# Patient Record
Sex: Male | Born: 1945 | Race: White | Hispanic: No | Marital: Married | Smoking: Former smoker
Health system: Southern US, Community
[De-identification: ages and names within clinical notes are randomized; demographics above are authoritative.]

## PROBLEM LIST (undated history)

## (undated) DIAGNOSIS — C50919 Malignant neoplasm of unspecified site of unspecified female breast: Secondary | ICD-10-CM

## (undated) DIAGNOSIS — Z87442 Personal history of urinary calculi: Secondary | ICD-10-CM

## (undated) DIAGNOSIS — Z8739 Personal history of other diseases of the musculoskeletal system and connective tissue: Secondary | ICD-10-CM

## (undated) DIAGNOSIS — E785 Hyperlipidemia, unspecified: Secondary | ICD-10-CM

## (undated) DIAGNOSIS — M1990 Unspecified osteoarthritis, unspecified site: Secondary | ICD-10-CM

## (undated) DIAGNOSIS — C61 Malignant neoplasm of prostate: Secondary | ICD-10-CM

## (undated) DIAGNOSIS — F32A Depression, unspecified: Secondary | ICD-10-CM

## (undated) DIAGNOSIS — F419 Anxiety disorder, unspecified: Secondary | ICD-10-CM

## (undated) DIAGNOSIS — I10 Essential (primary) hypertension: Secondary | ICD-10-CM

## (undated) DIAGNOSIS — N189 Chronic kidney disease, unspecified: Secondary | ICD-10-CM

## (undated) DIAGNOSIS — I739 Peripheral vascular disease, unspecified: Secondary | ICD-10-CM

## (undated) DIAGNOSIS — Z86718 Personal history of other venous thrombosis and embolism: Secondary | ICD-10-CM

## (undated) DIAGNOSIS — H9319 Tinnitus, unspecified ear: Secondary | ICD-10-CM

## (undated) DIAGNOSIS — K219 Gastro-esophageal reflux disease without esophagitis: Secondary | ICD-10-CM

## (undated) HISTORY — PX: TONSILLECTOMY: SUR1361

## (undated) HISTORY — PX: MASTECTOMY: SHX3

## (undated) HISTORY — PX: PROSTATE BIOPSY: SHX241

---

## 2002-02-20 ENCOUNTER — Ambulatory Visit (HOSPITAL_BASED_OUTPATIENT_CLINIC_OR_DEPARTMENT_OTHER): Admission: RE | Admit: 2002-02-20 | Discharge: 2002-02-20 | Payer: Self-pay | Admitting: Plastic Surgery

## 2002-02-20 ENCOUNTER — Encounter (INDEPENDENT_AMBULATORY_CARE_PROVIDER_SITE_OTHER): Payer: Self-pay | Admitting: Specialist

## 2004-12-08 ENCOUNTER — Ambulatory Visit (HOSPITAL_COMMUNITY): Admission: RE | Admit: 2004-12-08 | Discharge: 2004-12-08 | Payer: Self-pay | Admitting: Gastroenterology

## 2004-12-08 ENCOUNTER — Encounter (INDEPENDENT_AMBULATORY_CARE_PROVIDER_SITE_OTHER): Payer: Self-pay | Admitting: Specialist

## 2008-12-28 ENCOUNTER — Ambulatory Visit: Payer: Self-pay | Admitting: Radiology

## 2008-12-28 ENCOUNTER — Emergency Department (HOSPITAL_BASED_OUTPATIENT_CLINIC_OR_DEPARTMENT_OTHER): Admission: EM | Admit: 2008-12-28 | Discharge: 2008-12-28 | Payer: Self-pay | Admitting: Emergency Medicine

## 2010-02-27 ENCOUNTER — Observation Stay (HOSPITAL_COMMUNITY): Admission: EM | Admit: 2010-02-27 | Discharge: 2010-02-27 | Payer: Self-pay | Admitting: Emergency Medicine

## 2010-02-27 ENCOUNTER — Encounter: Admission: RE | Admit: 2010-02-27 | Discharge: 2010-02-27 | Payer: Self-pay | Admitting: Family Medicine

## 2011-01-13 LAB — DIFFERENTIAL
Basophils Absolute: 0 10*3/uL (ref 0.0–0.1)
Monocytes Absolute: 0.6 10*3/uL (ref 0.1–1.0)
Monocytes Relative: 10 % (ref 3–12)
Neutrophils Relative %: 63 % (ref 43–77)

## 2011-01-13 LAB — BASIC METABOLIC PANEL
BUN: 14 mg/dL (ref 6–23)
CO2: 26 mEq/L (ref 19–32)
Chloride: 110 mEq/L (ref 96–112)
Creatinine, Ser: 1.18 mg/dL (ref 0.4–1.5)
GFR calc non Af Amer: >60 mL/min (ref >60)
Sodium: 142 mEq/L (ref 135–145)

## 2011-01-13 LAB — CBC
Hemoglobin: 14.2 g/dL (ref 13.0–17.0)
MCHC: 34.9 g/dL (ref 30.0–36.0)
MCV: 87.7 fL (ref 78.0–100.0)

## 2011-01-13 LAB — APTT: aPTT: 67 seconds — ABNORMAL HIGH (ref 24–37)

## 2011-02-05 LAB — URINALYSIS, ROUTINE W REFLEX MICROSCOPIC
Bilirubin Urine: NEGATIVE
Glucose, UA: NEGATIVE mg/dL
Leukocytes, UA: NEGATIVE
Specific Gravity, Urine: 1.019 (ref 1.005–1.030)
Urobilinogen, UA: 0.2 mg/dL (ref 0.0–1.0)
pH: 6 (ref 5.0–8.0)

## 2011-02-05 LAB — URINE MICROSCOPIC-ADD ON

## 2011-03-13 NOTE — Op Note (Signed)
NAMEJOANDRY, Douglas Gay                ACCOUNT NO.:  [id number]   MEDICAL RECORD NO.:  [id number]          PATIENT TYPE:  AMB   LOCATION:  ENDO                         FACILITY:  [facility]   PHYSICIAN:  Anselmo Rod, M.D.  DATE OF BIRTH:  1946-01-01   DATE OF PROCEDURE:  12/08/2004  DATE OF DISCHARGE:  12/08/2004                                 OPERATIVE REPORT   PROCEDURE:  Colonoscopy with snare polypectomy and cold biopsies.   ENDOSCOPIST:  Anselmo Rod, M.D.   INSTRUMENT USED:  Olympus video colonoscope.   INDICATION FOR PROCEDURE:  A 65 year old white male who is undergoing a  screening colonoscopy to rule out colonic polyps, masses, hemorrhoids, etc.   PREPROCEDURE PREPARATION:  Informed consent was procured from the patient  and the patient fasted for 8 hours prior to the procedure and prepped with a  bottle of magnesium citrate and a gallon of GoLYTELY the night prior to the  procedure.  The risks and benefits of the procedure were discussed with the  patient in great detail, including the 10% miss rate of cancer and polyps.   PREPROCEDURE PHYSICAL:  The patient had stable vital signs, neck supple,  chest clear to auscultation, S1/S2 regular, abdomen soft with normal bowel  sounds.   DESCRIPTION OF PROCEDURE:  The patient was placed in the left lateral  decubitus position and sedated with 70 mg of Demerol and 10 mg of Versed in  slow incremental doses.  Once the patient was adequately sedated and  maintained on low-flow oxygen and continuous cardiac monitoring, the Olympus  video colonoscope was advanced from the rectum to the cecum.  There was a  significant amount of residual stool in the right colon and multiple washes  were done.  A small polyp was snared from 38 cm and internal hemorrhoids  were seen on retroflexion.  There was no evidence of sigmoid diverticulosis.  Biopsies were taken from the right colon.  The patient tolerated the  procedure well without  complications.   IMPRESSION:  1.  Small internal hemorrhoids seen on retroflexion.  2.  Small sessile polyp snared from 38 cm.  3.  Sigmoid diverticulosis.  4.  Biopsies removed from the proximal right colon.   RECOMMENDATIONS:  1.  Await pathology results.  2.  Avoid nonsteroidals, including aspirin for the next 4 weeks.  3.  Repeat colonoscopy depending on pathology results.  4.  Outpatient followup as need arises in the future.      JNM/MEDQ  D:  12/10/2004  T:  12/10/2004  Job:  [id number]   cc:   Evelena Peat, M.D.  [address]  [place]  [place] [address]  Fax: [phone]

## 2011-03-13 NOTE — Op Note (Signed)
[place]  Patient:    Douglas Gay, Douglas Gay Visit Number: [id number] MRN: [id number]          Service Type: DSU Location: [facility] Attending Physician:  Loura Halt Ii Dictated by:   Alfredia Ferguson, M.D. Proc. Date: 02/20/02 Admit Date:  02/20/2002                             Operative Report  PREOPERATIVE DIAGNOSIS: 1. A 5 mm pigmented nevus, right lower cheek. 2. A 5 mm pigmented nevus, right upper cheek. 3. A 5 mm pigmented nevus, left chin. 4. A 5 mm pigmented nevus, left lower cheek. 5. An 8 mm pigmented nevus, left mid cheek.  POSTOPERATIVE DIAGNOSIS: 1. A 5 mm pigmented nevus, right lower cheek. 2. A 5 mm pigmented nevus, right upper cheek. 3. A 5 mm pigmented nevus, left chin. 4. A 5 mm pigmented nevus, left lower cheek. 5. An 8 mm pigmented nevus, left mid cheek.  OPERATION PERFORMED:  Excision of pigmented nevi times five, left and right cheeks.  SURGEON:  Alfredia Ferguson, M.D.  ANESTHESIA:  2% Xylocaine with 1:100,000 epinephrine.  INDICATIONS FOR PROCEDURE:  The patient is a 65 year old gentleman who complains of slowly enlarging and darkening lesions on his face.  Because of the recent growth pattern, the patient wishes to have them removed.  None of the lesions appear malignant.  One is particularly dark in the left mid cheek. The patient wishes to proceed with removal in spite of the fact that he may be trading what he has for permanent and potentially unsightly scar.  DESCRIPTION OF PROCEDURE:  Skin markers were placed around each of the five lesions in elliptical fashion and local anesthesia was infiltrated.  Attention was first directed to the right cheek.  The cheek was prepped and draped in sterile fashion.  Elliptical excisions of both lesions were carried out down to the level of the subcutaneous tissues.  The lesions were passed off for pathology.  Hemostasis was accomplished using pressure.  The wounds  were closed with approximating the dermis using interrupted 5-0 Vicryl suture. Skin was united using a running 6-0 nylon suture for both incisions.  The cheek was cleansed, dried and light dressings were applied.  Attention was directed to the left cheek.  The lesion on the left chin, the left lower cheek and the left mid cheek were removed.  Each incision was excised in a similar fashion to the right side and closure was carried out in identical fashion. On completion of the closure, a light dressing was applied and the patient was discharged home in satisfactory condition. Dictated by:   Alfredia Ferguson, M.D. Attending Physician:  Loura Halt Ii DD:  02/20/02 TD:  02/20/02 Job: [id number]

## 2012-03-12 ENCOUNTER — Emergency Department (HOSPITAL_BASED_OUTPATIENT_CLINIC_OR_DEPARTMENT_OTHER)
Admission: EM | Admit: 2012-03-12 | Discharge: 2012-03-12 | Disposition: A | Discharge status: Home / Self Care | Payer: Medicare Other | Attending: Emergency Medicine | Admitting: Emergency Medicine

## 2012-03-12 ENCOUNTER — Emergency Department (HOSPITAL_BASED_OUTPATIENT_CLINIC_OR_DEPARTMENT_OTHER): Payer: Medicare Other

## 2012-03-12 ENCOUNTER — Encounter (HOSPITAL_BASED_OUTPATIENT_CLINIC_OR_DEPARTMENT_OTHER): Payer: Self-pay | Admitting: *Deleted

## 2012-03-12 DIAGNOSIS — I82409 Acute embolism and thrombosis of unspecified deep veins of unspecified lower extremity: Secondary | ICD-10-CM

## 2012-03-12 DIAGNOSIS — M79609 Pain in unspecified limb: Secondary | ICD-10-CM | POA: Insufficient documentation

## 2012-03-12 DIAGNOSIS — I824Y9 Acute embolism and thrombosis of unspecified deep veins of unspecified proximal lower extremity: Secondary | ICD-10-CM | POA: Insufficient documentation

## 2012-03-12 DIAGNOSIS — R609 Edema, unspecified: Secondary | ICD-10-CM | POA: Insufficient documentation

## 2012-03-12 MED ORDER — WARFARIN SODIUM 5 MG PO TABS: 5.0000 mg | ORAL_TABLET | Freq: Every day | ORAL | Status: DC

## 2012-03-12 MED ORDER — ENOXAPARIN SODIUM 100 MG/ML ~~LOC~~ SOLN
1.0000 mg/kg | Freq: Once | SUBCUTANEOUS | Status: AC
  Administered 2012-03-12: 85 mg via SUBCUTANEOUS
  Filled 2012-03-12: qty 1

## 2012-03-12 MED ORDER — ENOXAPARIN SODIUM 150 MG/ML ~~LOC~~ SOLN: 1.0000 mg/kg | Freq: Two times a day (BID) | SUBCUTANEOUS | Status: DC

## 2012-03-12 NOTE — Discharge Instructions (Signed)
Take coumadin as prescribed. Take lovenox as prescribed for the next 5 days while your coumadin level becomes therapeutic.  Follow up with your primary care doctor this Monday - arrange follow up on coumadin level/INR.  You may also discuss alternative anticoagulation strategies with your doctor then including pradaxa.  Return to [facility] ER right away if worse, severe leg pain, chest pain, trouble breathing, other concern.      Deep Vein Thrombosis A deep vein thrombosis (DVT) is a blood clot (thrombus) that develops in a deep vein. A DVT is a clot in the deep, larger veins of the leg, arm, or pelvis. These are more dangerous than clots that might form in veins on the surface of the body. Deep vein thrombosis can lead to complications if the clot breaks off and travels in the bloodstream to the lungs. CAUSES Blood clots form in a vein for different reasons. Usually several things cause blood clots. They include:  The flow of blood slows down.   The inside of the vein is damaged in some way.   The person has a condition that makes blood clot more easily. These conditions may include:   Older age (especially over 37 years old).   Having a history of blood clots.   Having major or lengthy surgery. Hip surgery is particularly high-risk.   Breaking a hip or leg.   Sitting or lying still for a long time.   Cancer or cancer treatment.   Having a long, thin tube (catheter) placed inside a vein during a medical procedure.   Being overweight (obese).   Pregnancy and childbirth.   Medicines with estrogen.   Smoking.   Other circulation or heart problems.  SYMPTOMS When a clot forms, it can either partially or totally block the blood flow in that vein. Symptoms of a DVT can include:  Swelling of the leg or arm, especially if one side is much worse.   Warmth and redness of the leg or arm, especially if one side is much worse.   Pain in an arm or leg. If the clot is in the leg,  symptoms may be more noticeable or worse when standing or walking.  If the blood clot travels to the lung, it may cause:  Shortness of breath.   Chest pain. The pain may be worsened by deep breaths.   Coughing up thick mucus (phlegm), possibly flecked with blood.  Anyone with these symptoms should get emergency medical treatment right away. Call your local emergency services (911 in [place] if you have these symptoms. DIAGNOSIS If a DVT is suspected, your caregiver will take a full medical history. He or she will also perform a physical exam. Tests that also may be required include:  Studies of the clotting properties of the blood.   An ultrasound scan.   X-rays to show the flow of blood when special dye is injected into the veins (venography).   Studies of your lungs if you have any chest symptoms.  PREVENTION  Exercise the legs regularly. Take a brisk 30 minute walk every day.   Maintain a weight that is appropriate for your height.   Avoid sitting or lying in bed for long periods of time without moving your legs.   Women, particularly those over the age of 34, should consider the risks and benefits of taking estrogen medicines, including birth control pills.   Do not smoke, especially if you take estrogen medicines.   Long-distance travel can increase your risk. You  should exercise your legs by walking or pumping the muscles every hour.   In hospital prevention:   Prevention may include medical and nonmedical measures.  TREATMENT  The most common treatment for DVT is blood thinning (anticoagulant) medicine, which reduces the blood's tendency to clot. Anticoagulants can stop new blood clots from forming and old ones from growing. They cannot dissolve existing clots. Your body does this by itself over time. Anticoagulants can be given by mouth, by intravenous (IV) access, or by injection. Your caregiver will determine the best program for you.   Less commonly, clot-dissolving  drugs (thrombolytics) are used to dissolve a DVT. They carry a high risk of bleeding, so they are used mainly in severe cases.   Very rarely, a blood clot in the leg needs to be removed surgically.   If you are unable to take anticoagulants, your caregiver may arrange for you to have a filter placed in a main vein in your belly (abdomen). This filter prevents clots from traveling to your lungs.  HOME CARE INSTRUCTIONS  Take all medicines prescribed by your caregiver. Follow the directions carefully.   You will most likely continue taking anticoagulants after you leave the hospital. Your caregiver will advise you on the length of treatment (usually 3 to 6 months, sometimes for life).   Taking too much or too little of an anticoagulant is dangerous. While taking this type of medicine, you will need to have regular blood tests to be sure the dose is correct. The dose can change for many reasons. It is critically important that you take this medicine exactly as prescribed, and that you have blood tests exactly as directed.   Many foods can interfere with anticoagulants. These include foods high in vitamin K, such as spinach, kale, broccoli, cabbage, collard and turnip greens, Brussels sprouts, peas, cauliflower, seaweed, parsley, beef and pork liver, green tea, and soybean oil. Your caregiver should discuss limits on these foods with you or you should arrange a visit with a dietician to answer your questions.   Many medicines can interfere with anticoagulants. You must tell your caregiver about any and all medicines you take. This includes all vitamins and supplements. Be especially cautious with aspirin and anti-inflammatory medicines. Ask your caregiver before taking these.   Anticoagulants can have side effects, mostly excessive bruising or bleeding. You will need to hold pressure over cuts for longer than usual. Avoid alcoholic drinks or consume only very small amounts while taking this medicine.     If you are taking an anticoagulant:   Wear a medical alert bracelet.   Notify your dentist or other caregivers before procedures.   Avoid contact sports.   Ask your caregiver how soon you can go back to normal activities. Not being active can lead to new clots. Ask for a list of what you should and should not do.   Exercise your lower leg muscles. This is important while traveling.   You may need to wear compression stockings. These are tight elastic stockings that apply pressure to the lower legs. This can help keep the blood in the legs from clotting.   If you are a smoker, you should quit.   Learn as much as you can about DVT.  SEEK MEDICAL CARE IF:  You have unusual bruising or any bleeding problems.   The swelling or pain in your affected arm or leg is not gradually improving.   You anticipate surgery or long-distance travel. You should get specific advice on  DVT prevention.   You discover other family members with blood clots. This may require further testing for inherited diseases or conditions.  SEEK IMMEDIATE MEDICAL CARE IF:  You develop chest pain.   You develop severe shortness of breath.   You begin to cough up bloody mucus or phlegm (sputum).   You feel dizzy or faint.   You develop swelling or pain in the leg.   You have breathing problems after traveling.  MAKE SURE YOU:  Understand these instructions.   Will watch your condition.   Will get help right away if you are not doing well or get worse.  Document Released: 10/12/2005 Document Revised: 10/01/2011 Document Reviewed: 12/04/2010 [facility] Patient Information 2012 [place], [place].

## 2012-03-12 NOTE — ED Provider Notes (Addendum)
History   This chart was scribed for Suzi Roots, MD by Shari Heritage. The patient was seen in room MH08/MH08. Patient's care was started at 1531.     CSN: [id number]  Arrival date & time 03/12/12  1531   First MD Initiated Contact with Patient 03/12/12 1624      Chief Complaint  Patient presents with  . Leg Pain    (Consider location/radiation/quality/duration/timing/severity/associated sxs/prior treatment) The history is provided by the patient. No language interpreter was used.   Douglas Gay is a 66 y.o. male who presents to the Emergency Department complaining of constant, moderate pain in right leg with associated edema onset . Patient experiences pain in upper and lower leg. Patient took Ibuprofen X 3 this morning with moderate relief. Pain worsens while standing. Patient with h/o of blood clots in left leg. Does not recall a specific cause or diagnosis associated with blood clots. Patient has experienced no trauma to right leg, but he reports sitting on a bar stool for several hours 4 days ago without stretching or extending his right leg. Patient has never smoked. Reports no other pertinent medical or surgical history. No recent immobility, trauma or surgery. Denies hx protein c or s def or other specific dx related to clotting. No cp or sob. No fever or chills. No skin changes or erythema.   Past Medical History  Diagnosis Date  . Hx of blood clots     History reviewed. No pertinent past surgical history.  History reviewed. No pertinent family history.  History  Substance Use Topics  . Smoking status: Never Smoker   . Smokeless tobacco: Not on file  . Alcohol Use: Yes      Review of Systems  Constitutional: Negative for fever and chills.  HENT: Negative for congestion and rhinorrhea.   Respiratory: Negative for cough and shortness of breath.   Cardiovascular: Negative for chest pain.  Gastrointestinal: Negative for nausea, vomiting and abdominal pain.    Musculoskeletal: Negative for back pain.       Pain and swelling in RLE  Skin: Negative for rash.  Neurological: Negative for weakness, numbness and headaches.    Allergies  Review of patient's allergies indicates no known allergies.  Home Medications   Current Outpatient Rx  Name Route Sig Dispense Refill  . HYDROCORTISONE 1 % EX CREA Topical Apply 1 application topically 2 (two) times daily. For rash on leg    . OMEPRAZOLE 20 MG PO CPDR Oral Take 20 mg by mouth daily.    Marland Kitchen ROSUVASTATIN CALCIUM 5 MG PO TABS Oral Take 5 mg by mouth daily.      BP 140/87  Pulse 105  Temp(Src) 98 F (36.7 C) (Oral)  Resp 18  Ht 5\' 11"  (1.803 m)  Wt 185 lb (83.915 kg)  BMI 25.80 kg/m2  SpO2 99%  Physical Exam  Nursing note and vitals reviewed. Constitutional: He is oriented to person, place, and time. He appears well-developed and well-nourished.  HENT:  Head: Atraumatic.  Eyes: Conjunctivae are normal.  Neck: Neck supple.  Cardiovascular: Normal rate.   Pulmonary/Chest: Effort normal.  Abdominal: He exhibits no distension.  Musculoskeletal: Normal range of motion. He exhibits edema and tenderness.       Diffuse tenderness and edema in RLE, good pulses fem and distally. No skin changes or erythema. Good rom. No focal bony tenderness or focal sts.   Neurological: He is alert and oriented to person, place, and time.  Motor intact bil.   Skin: Skin is warm and dry. No rash noted. No erythema.  Psychiatric: He has a normal mood and affect.    ED Course  Procedures (including critical care time) DIAGNOSTIC STUDIES: Oxygen Saturation is 99% on room air, normal by my interpretation.    COORDINATION OF CARE: 4:30PM -Patient informed of current plan for treatment and evaluation and agrees with plan at this time.    US Venous Img Lower Unilateral Right  03/12/2012  *RADIOLOGY REPORT*  Clinical Data: Right thigh and knee pain.  History of DVT.  RIGHT LOWER EXTREMITY VENOUS DUPLEX  ULTRASOUND  Technique: Gray-scale sonography with graded compression, as well as color Doppler and duplex ultrasound, were performed to evaluate the deep venous system of the right lower extremity from the level of the common femoral vein through the popliteal and proximal calf veins. Spectral Doppler was utilized to evaluate flow at rest and with distal augmentation maneuvers.  Comparison: No comparison studies available.  Findings: The right common femoral and deep femoral vein show normal patent directional flow, normal phasicity, normal augmentation, and normal compression.  The superficial femoral vein proximally down to the popliteal vein is not compressible.  There is no discernible flow within these vessels are not compressible.  IMPRESSION: Lack of discernible flow and no compressibility of the right superficial femoral vein and popliteal vein is compatible with thrombosis.  Critical Value/emergent results were called by telephone at the time of interpretation on 03/12/2012  at 1822 hours  to  Dr. Denton Lank, who verbally acknowledged these results.  Original Report Authenticated By: ERIC A. MANSELL, M.D.       MDM  U/s ordered.   Reviewed nursing notes and prior charts for additional history.   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  lovenox sq. Will rx coumadin and lovenox, close pcp f/u.    I personally performed the services described in this documentation, which was scribed in my presence. The recorded information has been reviewed and considered. Suzi Roots, MD     Suzi Roots, MD 03/12/12 1610  Suzi Roots, MD 03/12/12 Jerene Bears

## 2012-03-12 NOTE — ED Notes (Signed)
Pt states he has a hx of clots and this feels similar. C/O right lower leg pain since last Tuesday. Took Ibuprofen X 3 this a.m. With some relief. Worse with standing

## 2014-11-29 ENCOUNTER — Emergency Department (HOSPITAL_BASED_OUTPATIENT_CLINIC_OR_DEPARTMENT_OTHER): Payer: Medicare Other

## 2014-11-29 ENCOUNTER — Emergency Department (HOSPITAL_BASED_OUTPATIENT_CLINIC_OR_DEPARTMENT_OTHER)
Admission: EM | Admit: 2014-11-29 | Discharge: 2014-11-29 | Disposition: A | Discharge status: Home / Self Care | Payer: Medicare Other | Attending: Emergency Medicine | Admitting: Emergency Medicine

## 2014-11-29 ENCOUNTER — Encounter (HOSPITAL_BASED_OUTPATIENT_CLINIC_OR_DEPARTMENT_OTHER): Payer: Self-pay

## 2014-11-29 DIAGNOSIS — Z7901 Long term (current) use of anticoagulants: Secondary | ICD-10-CM | POA: Insufficient documentation

## 2014-11-29 DIAGNOSIS — M79672 Pain in left foot: Secondary | ICD-10-CM | POA: Diagnosis present

## 2014-11-29 DIAGNOSIS — Z7952 Long term (current) use of systemic steroids: Secondary | ICD-10-CM | POA: Diagnosis not present

## 2014-11-29 DIAGNOSIS — Z79899 Other long term (current) drug therapy: Secondary | ICD-10-CM | POA: Insufficient documentation

## 2014-11-29 DIAGNOSIS — Z86718 Personal history of other venous thrombosis and embolism: Secondary | ICD-10-CM | POA: Diagnosis not present

## 2014-11-29 MED ORDER — HYDROCODONE-ACETAMINOPHEN 5-325 MG PO TABS: 1.0000 | ORAL_TABLET | ORAL | Status: DC | PRN

## 2014-11-29 MED ORDER — CEPHALEXIN 500 MG PO CAPS: 500.0000 mg | ORAL_CAPSULE | Freq: Four times a day (QID) | ORAL | Status: DC

## 2014-11-29 MED ORDER — COLCRYS 0.6 MG PO TABS: ORAL_TABLET | ORAL | Status: DC

## 2014-11-29 NOTE — ED Provider Notes (Signed)
CSN: [id number]     Arrival date & time 11/29/14  0254 History   First MD Initiated Contact with Patient 11/29/14 [phone]     Chief Complaint  Patient presents with  . Foot Pain    HPI Pt had an injection in his buttock 10 days ago.  He had a prostate biopsy and he was given a 2 injection in the buttock.  He was not sure what that was.  He noticed it was black and blue and is still swollen.  2 days ago he noticed redness and swelling on the left foot.  Pain is located around his big toe. The pain increases with movement and palpation. He denies any fevers or chills. He denies any recent injuries. No history of gout. Past Medical History  Diagnosis Date  . Hx of blood clots    Past Surgical History  Procedure Laterality Date  . Prostate biopsy     No family history on file. History  Substance Use Topics  . Smoking status: Never Smoker   . Smokeless tobacco: Not on file  . Alcohol Use: Yes     Comment: 2 x week    Review of Systems  All other systems reviewed and are negative.     Allergies  Review of patient's allergies indicates no known allergies.  Home Medications   Prior to Admission medications   Medication Sig Start Date End Date Taking? Authorizing Provider  enoxaparin (LOVENOX) 150 MG/ML injection Inject 0.56 mLs (85 mg total) into the skin every 12 (twelve) hours. For 5 days 03/12/12   Mirna Mires, MD  hydrocortisone cream 1 % Apply 1 application topically 2 (two) times daily. For rash on leg    Historical Provider, MD  omeprazole (PRILOSEC) 20 MG capsule Take 20 mg by mouth daily.    Historical Provider, MD  rosuvastatin (CRESTOR) 5 MG tablet Take 5 mg by mouth daily.    Historical Provider, MD  warfarin (COUMADIN) 5 MG tablet Take 1 tablet (5 mg total) by mouth daily. 03/12/12 03/12/13  Mirna Mires, MD   BP 156/79 mmHg  Pulse 86  Temp(Src) 98.6 F (37 C) (Oral)  Resp 16  Ht 5\' 11"  (1.803 m)  Wt 190 lb (86.183 kg)  BMI 26.51 kg/m2  SpO2 100% Physical  Exam  Constitutional: He appears well-developed and well-nourished. No distress.  HENT:  Head: Normocephalic and atraumatic.  Right Ear: External ear normal.  Left Ear: External ear normal.  Eyes: Conjunctivae are normal. Right eye exhibits no discharge. Left eye exhibits no discharge. No scleral icterus.  Neck: Neck supple. No tracheal deviation present.  Cardiovascular: Normal rate.   Pulmonary/Chest: Effort normal. No stridor. No respiratory distress.  Musculoskeletal: He exhibits no edema.  Right buttock with an area of induration, ecchymoses and tenderness, no evidence of fluctuance or increased warmth; left great toe at the metatarsal phalangeal joint with erythema on the dorsal aspect, mild tenderness to palpation, mild pain with range of motion question of some erythema extending proximally, calf is soft and nontender  Neurological: He is alert. Cranial nerve deficit: no gross deficits.  Skin: Skin is warm and dry. No rash noted.  Psychiatric: He has a normal mood and affect.  Nursing note and vitals reviewed.   ED Course  Procedures (including critical care time) Labs Review Labs Reviewed - No data to display  Imaging Review Dg Foot Complete Left  11/29/2014   CLINICAL DATA:  redness metacarpophalangeal joint first toe for 2 days,  no known injury  EXAM: LEFT FOOT - COMPLETE 3+ VIEW  COMPARISON:  None.  FINDINGS: Three views of the left foot submitted. No acute fracture or subluxation. No radiopaque foreign body. Small plantar spur of calcaneus.  IMPRESSION: No acute fracture or subluxation.  Small plantar spur of calcaneus.   Electronically Signed   By: Lahoma Crocker M.D.   On: 11/29/2014 09:39     EKG Interpretation None      MDM   Final diagnoses:  Foot pain, left    The patient's symptoms could be related to a mild gout attack considering the location of the pain. He does not have any history of gout however in the past. Early cellulitis is also a possibility  considering the erythema to the skin.  Patient plans on traveling at a town in the next week. I will give him a course of antibiotics to take. Follow up with his doctor to have it rechecked, his symptoms don't improve he could consider taking the colchicine as well. I will avoid NSAIDs considering his chronic anticoagulant use.    Dorie Rank, MD 11/29/14 1000

## 2014-11-29 NOTE — Discharge Instructions (Signed)

## 2014-11-29 NOTE — ED Notes (Signed)
Left foot pain and swelling x 2 days.  Unknown injury.  Also reports he received ABX injection over 1 week ago in right buttock and is now red, edematous and painful.

## 2015-02-05 ENCOUNTER — Other Ambulatory Visit: Payer: Self-pay | Admitting: Urology

## 2015-02-27 NOTE — Patient Instructions (Addendum)
YOUR PROCEDURE IS SCHEDULED ON :  03/13/15  REPORT TO [facility] MAIN ENTRANCE FOLLOW SIGNS TO SHORT STAY CENTER AT :  10:45 AM  CALL THIS NUMBER IF YOU HAVE PROBLEMS THE MORNING OF SURGERY [phone]  REMEMBER:  DO NOT EAT FOOD OR DRINK LIQUIDS AFTER MIDNIGHT  TAKE THESE MEDICINES THE MORNING OF SURGERY: OMEPRAZOLE / CRESTOR  FOLLOW BOWEL PREP INSTRUCTIONS  YOU MAY NOT HAVE ANY METAL ON YOUR BODY INCLUDING HAIR PINS AND PIERCING'S. DO NOT WEAR JEWELRY, MAKEUP, LOTIONS, POWDERS OR PERFUMES. DO NOT WEAR NAIL POLISH. DO NOT SHAVE 48 HRS PRIOR TO SURGERY. MEN MAY SHAVE FACE AND NECK.  DO NOT [place]. [facility] IS NOT RESPONSIBLE FOR VALUABLES.  CONTACTS, DENTURES OR PARTIALS MAY NOT BE WORN TO SURGERY. LEAVE SUITCASE IN CAR. CAN BE BROUGHT TO ROOM AFTER SURGERY.  PATIENTS DISCHARGED THE DAY OF SURGERY WILL NOT BE ALLOWED TO DRIVE HOME.  PLEASE READ OVER THE FOLLOWING INSTRUCTION SHEETS _________________________________________________________________________________                                          [facility] - PREPARING FOR SURGERY  Before surgery, you can play an important role.  Because skin is not sterile, your skin needs to be as free of germs as possible.  You can reduce the number of germs on your skin by washing with CHG (chlorahexidine gluconate) soap before surgery.  CHG is an antiseptic cleaner which kills germs and bonds with the skin to continue killing germs even after washing. Please DO NOT use if you have an allergy to CHG or antibacterial soaps.  If your skin becomes reddened/irritated stop using the CHG and inform your nurse when you arrive at Short Stay. Do not shave (including legs and underarms) for at least 48 hours prior to the first CHG shower.  You may shave your face. Please follow these instructions carefully:   1.  Shower with CHG Soap the night before surgery and the  morning of Surgery.   2.  If you  choose to wash your hair, wash your hair first as usual with your  normal  Shampoo.   3.  After you shampoo, rinse your hair and body thoroughly to remove the  shampoo.                                         4.  Use CHG as you would any other liquid soap.  You can apply chg directly  to the skin and wash . Gently wash with scrungie or clean wascloth    5.  Apply the CHG Soap to your body ONLY FROM THE NECK DOWN.   Do not use on open                           Wound or open sores. Avoid contact with eyes, ears mouth and genitals (private parts).                        Genitals (private parts) with your normal soap.              6.  Wash thoroughly, paying special attention to the area where your  surgery  will be performed.   7.  Thoroughly rinse your body with warm water from the neck down.   8.  DO NOT shower/wash with your normal soap after using and rinsing off  the CHG Soap .                9.  Pat yourself dry with a clean towel.             10.  Wear clean night clothes to bed after shower             11.  Place clean sheets on your bed the night of your first shower and do not  sleep with pets.  Day of Surgery : Do not apply any lotions/deodorants the morning of surgery.  Please wear clean clothes to the hospital/surgery center.  FAILURE TO FOLLOW THESE INSTRUCTIONS MAY RESULT IN THE CANCELLATION OF YOUR SURGERY    PATIENT SIGNATURE_________________________________  ______________________________________________________________________    Marland Kitchen

## 2015-02-28 ENCOUNTER — Encounter (HOSPITAL_COMMUNITY)
Admission: RE | Admit: 2015-02-28 | Discharge: 2015-02-28 | Disposition: A | Discharge status: Home / Self Care | Payer: Medicare Other | Source: Ambulatory Visit | Attending: Urology | Admitting: Urology

## 2015-02-28 ENCOUNTER — Encounter (HOSPITAL_COMMUNITY): Payer: Self-pay

## 2015-02-28 DIAGNOSIS — Z01818 Encounter for other preprocedural examination: Secondary | ICD-10-CM | POA: Diagnosis not present

## 2015-02-28 HISTORY — DX: Malignant neoplasm of prostate: C61

## 2015-02-28 HISTORY — DX: Personal history of other diseases of the musculoskeletal system and connective tissue: Z87.39

## 2015-02-28 HISTORY — DX: Personal history of urinary calculi: Z87.442

## 2015-02-28 HISTORY — DX: Unspecified osteoarthritis, unspecified site: M19.90

## 2015-02-28 HISTORY — DX: Tinnitus, unspecified ear: H93.19

## 2015-02-28 HISTORY — DX: Hyperlipidemia, unspecified: E78.5

## 2015-02-28 HISTORY — DX: Gastro-esophageal reflux disease without esophagitis: K21.9

## 2015-02-28 HISTORY — DX: Personal history of other venous thrombosis and embolism: Z86.718

## 2015-02-28 LAB — BASIC METABOLIC PANEL
ANION GAP: 4 — AB (ref 5–15)
BUN: 20 mg/dL (ref 6–20)
CHLORIDE: 109 mmol/L (ref 101–111)
CO2: 25 mmol/L (ref 22–32)
Calcium: 9.4 mg/dL (ref 8.9–10.3)
Creatinine, Ser: 1.25 mg/dL — ABNORMAL HIGH (ref 0.61–1.24)
GFR calc non Af Amer: 57 mL/min — ABNORMAL LOW (ref >60)
Glucose, Bld: 101 mg/dL — ABNORMAL HIGH (ref 70–99)
POTASSIUM: 4.2 mmol/L (ref 3.5–5.1)
Sodium: 138 mmol/L (ref 135–145)

## 2015-02-28 LAB — CBC
HEMATOCRIT: 39.8 % (ref 39.0–52.0)
HEMOGLOBIN: 13.9 g/dL (ref 13.0–17.0)
MCH: 30.1 pg (ref 26.0–34.0)
MCHC: 34.9 g/dL (ref 30.0–36.0)
MCV: 86.1 fL (ref 78.0–100.0)
Platelets: 243 10*3/uL (ref 150–400)
RBC: 4.62 MIL/uL (ref 4.22–5.81)
RDW: 14.2 % (ref 11.5–15.5)
WBC: 4.8 10*3/uL (ref 4.0–10.5)

## 2015-02-28 LAB — PROTIME-INR
INR: 1.84 — ABNORMAL HIGH (ref 0.00–1.49)
PROTHROMBIN TIME: 21.4 s — AB (ref 11.6–15.2)

## 2015-02-28 LAB — APTT: APTT: 77 s — AB (ref 24–37)

## 2015-03-13 ENCOUNTER — Inpatient Hospital Stay (HOSPITAL_COMMUNITY)
Admission: RE | Admit: 2015-03-13 | Discharge: 2015-03-14 | DRG: 708 | Disposition: A | Discharge status: Home / Self Care | Payer: Medicare Other | Source: Ambulatory Visit | Attending: Urology | Admitting: Urology

## 2015-03-13 ENCOUNTER — Inpatient Hospital Stay (HOSPITAL_COMMUNITY): Payer: Medicare Other | Admitting: Certified Registered Nurse Anesthetist

## 2015-03-13 ENCOUNTER — Encounter (HOSPITAL_COMMUNITY)
Admission: RE | Disposition: A | Discharge status: Home / Self Care | Payer: Self-pay | Source: Ambulatory Visit | Attending: Urology

## 2015-03-13 ENCOUNTER — Encounter (HOSPITAL_COMMUNITY): Payer: Self-pay | Admitting: *Deleted

## 2015-03-13 DIAGNOSIS — Z86718 Personal history of other venous thrombosis and embolism: Secondary | ICD-10-CM | POA: Diagnosis not present

## 2015-03-13 DIAGNOSIS — Z87442 Personal history of urinary calculi: Secondary | ICD-10-CM

## 2015-03-13 DIAGNOSIS — K219 Gastro-esophageal reflux disease without esophagitis: Secondary | ICD-10-CM | POA: Diagnosis present

## 2015-03-13 DIAGNOSIS — E785 Hyperlipidemia, unspecified: Secondary | ICD-10-CM | POA: Diagnosis not present

## 2015-03-13 DIAGNOSIS — Z87891 Personal history of nicotine dependence: Secondary | ICD-10-CM

## 2015-03-13 DIAGNOSIS — C61 Malignant neoplasm of prostate: Principal | ICD-10-CM | POA: Diagnosis present

## 2015-03-13 DIAGNOSIS — M109 Gout, unspecified: Secondary | ICD-10-CM | POA: Diagnosis present

## 2015-03-13 DIAGNOSIS — M1990 Unspecified osteoarthritis, unspecified site: Secondary | ICD-10-CM | POA: Diagnosis not present

## 2015-03-13 HISTORY — PX: LYMPHADENECTOMY: SHX5960

## 2015-03-13 HISTORY — PX: ROBOT ASSISTED LAPAROSCOPIC RADICAL PROSTATECTOMY: SHX5141

## 2015-03-13 LAB — TYPE AND SCREEN
ABO/RH(D): A POS
Antibody Screen: NEGATIVE

## 2015-03-13 LAB — ABO/RH: ABO/RH(D): A POS

## 2015-03-13 LAB — HEMOGLOBIN AND HEMATOCRIT, BLOOD
HCT: 34.6 % — ABNORMAL LOW (ref 39.0–52.0)
HEMOGLOBIN: 11.5 g/dL — AB (ref 13.0–17.0)

## 2015-03-13 LAB — PROTIME-INR
INR: 1.07 (ref 0.00–1.49)
Prothrombin Time: 14 seconds (ref 11.6–15.2)

## 2015-03-13 LAB — APTT: aPTT: 74 seconds — ABNORMAL HIGH (ref 24–37)

## 2015-03-13 SURGERY — ROBOTIC ASSISTED LAPAROSCOPIC RADICAL PROSTATECTOMY
Anesthesia: General

## 2015-03-13 MED ORDER — BUPIVACAINE LIPOSOME 1.3 % IJ SUSP
INTRAMUSCULAR | Status: DC | PRN
  Administered 2015-03-13: 20 mL

## 2015-03-13 MED ORDER — ONDANSETRON HCL 4 MG/2ML IJ SOLN
INTRAMUSCULAR | Status: AC
  Filled 2015-03-13: qty 2

## 2015-03-13 MED ORDER — PANTOPRAZOLE SODIUM 40 MG PO TBEC
80.0000 mg | DELAYED_RELEASE_TABLET | Freq: Every day | ORAL | Status: DC
  Administered 2015-03-13 – 2015-03-14 (×2): 80 mg via ORAL
  Filled 2015-03-13 (×2): qty 2

## 2015-03-13 MED ORDER — PROPOFOL 10 MG/ML IV BOLUS
INTRAVENOUS | Status: AC
  Filled 2015-03-13: qty 20

## 2015-03-13 MED ORDER — ENOXAPARIN SODIUM 40 MG/0.4ML ~~LOC~~ SOLN
40.0000 mg | SUBCUTANEOUS | Status: DC
  Administered 2015-03-14: 40 mg via SUBCUTANEOUS
  Filled 2015-03-13: qty 0.4

## 2015-03-13 MED ORDER — BUPIVACAINE LIPOSOME 1.3 % IJ SUSP
20.0000 mL | Freq: Once | INTRAMUSCULAR | Status: DC
  Filled 2015-03-13: qty 20

## 2015-03-13 MED ORDER — DEXAMETHASONE SODIUM PHOSPHATE 10 MG/ML IJ SOLN
INTRAMUSCULAR | Status: DC | PRN
  Administered 2015-03-13: 10 mg via INTRAVENOUS

## 2015-03-13 MED ORDER — HYDROMORPHONE HCL 2 MG/ML IJ SOLN
INTRAMUSCULAR | Status: AC
  Filled 2015-03-13: qty 1

## 2015-03-13 MED ORDER — MIDAZOLAM HCL 2 MG/2ML IJ SOLN
INTRAMUSCULAR | Status: AC
  Filled 2015-03-13: qty 2

## 2015-03-13 MED ORDER — HYDROCODONE-ACETAMINOPHEN 5-325 MG PO TABS: 1.0000 | ORAL_TABLET | Freq: Four times a day (QID) | ORAL | Status: DC | PRN

## 2015-03-13 MED ORDER — CIPROFLOXACIN HCL 500 MG PO TABS: 500.0000 mg | ORAL_TABLET | Freq: Two times a day (BID) | ORAL | Status: DC

## 2015-03-13 MED ORDER — PROMETHAZINE HCL 25 MG/ML IJ SOLN: 6.2500 mg | INTRAMUSCULAR | Status: DC | PRN

## 2015-03-13 MED ORDER — OXYCODONE HCL 5 MG PO TABS
5.0000 mg | ORAL_TABLET | ORAL | Status: DC | PRN
  Administered 2015-03-13: 5 mg via ORAL
  Filled 2015-03-13: qty 1

## 2015-03-13 MED ORDER — HYDROMORPHONE HCL 1 MG/ML IJ SOLN
0.5000 mg | INTRAMUSCULAR | Status: DC | PRN
  Administered 2015-03-13 (×2): 1 mg via INTRAVENOUS
  Filled 2015-03-13 (×3): qty 1

## 2015-03-13 MED ORDER — LIDOCAINE HCL (CARDIAC) 20 MG/ML IV SOLN
INTRAVENOUS | Status: DC | PRN
  Administered 2015-03-13: 100 mg via INTRAVENOUS

## 2015-03-13 MED ORDER — PROPOFOL 10 MG/ML IV BOLUS
INTRAVENOUS | Status: DC | PRN
  Administered 2015-03-13: 170 mg via INTRAVENOUS

## 2015-03-13 MED ORDER — MIDAZOLAM HCL 5 MG/5ML IJ SOLN
INTRAMUSCULAR | Status: DC | PRN
  Administered 2015-03-13: 2 mg via INTRAVENOUS

## 2015-03-13 MED ORDER — HYDROMORPHONE HCL 1 MG/ML IJ SOLN
INTRAMUSCULAR | Status: DC | PRN
  Administered 2015-03-13 (×2): 1 mg via INTRAVENOUS

## 2015-03-13 MED ORDER — NEOSTIGMINE METHYLSULFATE 10 MG/10ML IV SOLN
INTRAVENOUS | Status: DC | PRN
  Administered 2015-03-13: 4 mg via INTRAVENOUS

## 2015-03-13 MED ORDER — FENTANYL CITRATE (PF) 100 MCG/2ML IJ SOLN
INTRAMUSCULAR | Status: DC | PRN
  Administered 2015-03-13 (×8): 50 ug via INTRAVENOUS

## 2015-03-13 MED ORDER — CEFAZOLIN SODIUM-DEXTROSE 2-3 GM-% IV SOLR
2.0000 g | INTRAVENOUS | Status: AC
  Administered 2015-03-13: 2 g via INTRAVENOUS

## 2015-03-13 MED ORDER — LACTATED RINGERS IV SOLN
INTRAVENOUS | Status: DC
  Administered 2015-03-13 (×3): via INTRAVENOUS

## 2015-03-13 MED ORDER — LACTATED RINGERS IR SOLN
Status: DC | PRN
  Administered 2015-03-13: 1

## 2015-03-13 MED ORDER — SODIUM CHLORIDE 0.9 % IJ SOLN
INTRAMUSCULAR | Status: DC | PRN
  Administered 2015-03-13: 20 mL

## 2015-03-13 MED ORDER — NEOSTIGMINE METHYLSULFATE 10 MG/10ML IV SOLN
INTRAVENOUS | Status: AC
  Filled 2015-03-13: qty 1

## 2015-03-13 MED ORDER — HEPARIN SODIUM (PORCINE) 1000 UNIT/ML IJ SOLN
INTRAMUSCULAR | Status: AC
  Filled 2015-03-13: qty 1

## 2015-03-13 MED ORDER — CEFAZOLIN SODIUM-DEXTROSE 2-3 GM-% IV SOLR
INTRAVENOUS | Status: AC
  Filled 2015-03-13: qty 50

## 2015-03-13 MED ORDER — ROCURONIUM BROMIDE 100 MG/10ML IV SOLN
INTRAVENOUS | Status: AC
  Filled 2015-03-13: qty 1

## 2015-03-13 MED ORDER — SUCCINYLCHOLINE CHLORIDE 20 MG/ML IJ SOLN
INTRAMUSCULAR | Status: DC | PRN
  Administered 2015-03-13: 100 mg via INTRAVENOUS

## 2015-03-13 MED ORDER — SODIUM CHLORIDE 0.9 % IJ SOLN
INTRAMUSCULAR | Status: AC
  Filled 2015-03-13: qty 20

## 2015-03-13 MED ORDER — GLYCOPYRROLATE 0.2 MG/ML IJ SOLN
INTRAMUSCULAR | Status: DC | PRN
  Administered 2015-03-13: 0.6 mg via INTRAVENOUS

## 2015-03-13 MED ORDER — HYDROMORPHONE HCL 1 MG/ML IJ SOLN
INTRAMUSCULAR | Status: AC
  Filled 2015-03-13: qty 1

## 2015-03-13 MED ORDER — PHENYLEPHRINE HCL 10 MG/ML IJ SOLN
INTRAMUSCULAR | Status: DC | PRN
  Administered 2015-03-13: 40 ug via INTRAVENOUS

## 2015-03-13 MED ORDER — ROCURONIUM BROMIDE 100 MG/10ML IV SOLN
INTRAVENOUS | Status: DC | PRN
  Administered 2015-03-13: 10 mg via INTRAVENOUS
  Administered 2015-03-13: 50 mg via INTRAVENOUS
  Administered 2015-03-13 (×2): 10 mg via INTRAVENOUS

## 2015-03-13 MED ORDER — STERILE WATER FOR IRRIGATION IR SOLN
Status: DC | PRN
  Administered 2015-03-13: 1500 mL

## 2015-03-13 MED ORDER — GLYCOPYRROLATE 0.2 MG/ML IJ SOLN
INTRAMUSCULAR | Status: AC
  Filled 2015-03-13: qty 3

## 2015-03-13 MED ORDER — SODIUM CHLORIDE 0.9 % IV BOLUS (SEPSIS)
1000.0000 mL | Freq: Once | INTRAVENOUS | Status: AC
  Administered 2015-03-13: 1000 mL via INTRAVENOUS

## 2015-03-13 MED ORDER — ONDANSETRON HCL 4 MG/2ML IJ SOLN
INTRAMUSCULAR | Status: DC | PRN
  Administered 2015-03-13: 4 mg via INTRAVENOUS

## 2015-03-13 MED ORDER — LIDOCAINE HCL (CARDIAC) 20 MG/ML IV SOLN
INTRAVENOUS | Status: AC
  Filled 2015-03-13: qty 5

## 2015-03-13 MED ORDER — FENTANYL CITRATE (PF) 250 MCG/5ML IJ SOLN
INTRAMUSCULAR | Status: AC
  Filled 2015-03-13: qty 5

## 2015-03-13 MED ORDER — MEPERIDINE HCL 50 MG/ML IJ SOLN: 6.2500 mg | INTRAMUSCULAR | Status: DC | PRN

## 2015-03-13 MED ORDER — ACETAMINOPHEN 500 MG PO TABS
1000.0000 mg | ORAL_TABLET | Freq: Four times a day (QID) | ORAL | Status: AC
  Administered 2015-03-13 – 2015-03-14 (×4): 1000 mg via ORAL
  Filled 2015-03-13 (×6): qty 2

## 2015-03-13 MED ORDER — ONDANSETRON HCL 4 MG/2ML IJ SOLN: 4.0000 mg | INTRAMUSCULAR | Status: DC | PRN

## 2015-03-13 MED ORDER — PHENYLEPHRINE 40 MCG/ML (10ML) SYRINGE FOR IV PUSH (FOR BLOOD PRESSURE SUPPORT)
PREFILLED_SYRINGE | INTRAVENOUS | Status: AC
  Filled 2015-03-13: qty 10

## 2015-03-13 MED ORDER — HYDROMORPHONE HCL 1 MG/ML IJ SOLN
0.2500 mg | INTRAMUSCULAR | Status: DC | PRN
  Administered 2015-03-13 (×2): 0.5 mg via INTRAVENOUS

## 2015-03-13 MED ORDER — DEXTROSE-NACL 5-0.45 % IV SOLN
INTRAVENOUS | Status: DC
  Administered 2015-03-13 – 2015-03-14 (×2): via INTRAVENOUS

## 2015-03-13 SURGICAL SUPPLY — 54 items
CABLE HIGH FREQUENCY MONO STRZ (ELECTRODE) ×4 IMPLANT
CATH FOLEY 2WAY SLVR 18FR 30CC (CATHETERS) ×4 IMPLANT
CATH TIEMANN FOLEY 18FR 5CC (CATHETERS) ×4 IMPLANT
CHLORAPREP W/TINT 26ML (MISCELLANEOUS) ×4 IMPLANT
CLIP LIGATING HEM O LOK PURPLE (MISCELLANEOUS) ×8 IMPLANT
CLOTH BEACON ORANGE TIMEOUT ST (SAFETY) ×4 IMPLANT
CONT SPEC 4OZ CLIKSEAL STRL BL (MISCELLANEOUS) ×4 IMPLANT
COVER SURGICAL LIGHT HANDLE (MISCELLANEOUS) ×4 IMPLANT
COVER TIP SHEARS 8 DVNC (MISCELLANEOUS) ×2 IMPLANT
COVER TIP SHEARS 8MM DA VINCI (MISCELLANEOUS) ×2
CUTTER ECHEON FLEX ENDO 45 340 (ENDOMECHANICALS) ×4 IMPLANT
DECANTER SPIKE VIAL GLASS SM (MISCELLANEOUS) ×4 IMPLANT
DRSG TEGADERM 4X4.75 (GAUZE/BANDAGES/DRESSINGS) ×8 IMPLANT
DRSG TEGADERM 6X8 (GAUZE/BANDAGES/DRESSINGS) ×8 IMPLANT
ELECT REM PT RETURN 9FT ADLT (ELECTROSURGICAL) ×4
ELECTRODE REM PT RTRN 9FT ADLT (ELECTROSURGICAL) ×2 IMPLANT
GAUZE SPONGE 2X2 8PLY STRL LF (GAUZE/BANDAGES/DRESSINGS) ×2 IMPLANT
GLOVE BIO SURGEON STRL SZ 6.5 (GLOVE) ×3 IMPLANT
GLOVE BIO SURGEONS STRL SZ 6.5 (GLOVE) ×1
GLOVE BIOGEL M STRL SZ7.5 (GLOVE) ×8 IMPLANT
GLOVE BIOGEL PI IND STRL 7.5 (GLOVE) ×2 IMPLANT
GLOVE BIOGEL PI INDICATOR 7.5 (GLOVE) ×2
GOWN STRL REUS W/TWL LRG LVL3 (GOWN DISPOSABLE) ×8 IMPLANT
GOWN STRL REUS W/TWL LRG LVL4 (GOWN DISPOSABLE) ×12 IMPLANT
HOLDER FOLEY CATH W/STRAP (MISCELLANEOUS) ×4 IMPLANT
IV LACTATED RINGERS 1000ML (IV SOLUTION) ×4 IMPLANT
KIT ACCESSORY DA VINCI DISP (KITS) ×2
KIT ACCESSORY DVNC DISP (KITS) ×2 IMPLANT
KIT PROCEDURE DA VINCI SI (MISCELLANEOUS) ×2
KIT PROCEDURE DVNC SI (MISCELLANEOUS) ×2 IMPLANT
LIQUID BAND (GAUZE/BANDAGES/DRESSINGS) ×4 IMPLANT
MARKER SKIN SURG 5.25 VIO NS (MISCELLANEOUS) ×4 IMPLANT
NEEDLE INSUFFLATION 14GA 120MM (NEEDLE) ×4 IMPLANT
PACK ROBOT UROLOGY CUSTOM (CUSTOM PROCEDURE TRAY) ×4 IMPLANT
PAD POSITIONING PINK XL (MISCELLANEOUS) IMPLANT
RELOAD GREEN ECHELON 45 (STAPLE) ×4 IMPLANT
SET TUBE IRRIG SUCTION NO TIP (IRRIGATION / IRRIGATOR) ×4 IMPLANT
SHEET LAVH (DRAPES) IMPLANT
SOLUTION ELECTROLUBE (MISCELLANEOUS) ×4 IMPLANT
SPONGE GAUZE 2X2 STER 10/PKG (GAUZE/BANDAGES/DRESSINGS) ×2
SPONGE LAP 4X18 X RAY DECT (DISPOSABLE) ×4 IMPLANT
SUT ETHILON 3 0 PS 1 (SUTURE) ×4 IMPLANT
SUT MNCRL AB 4-0 PS2 18 (SUTURE) ×8 IMPLANT
SUT PDS AB 1 CT1 27 (SUTURE) ×8 IMPLANT
SUT VIC AB 2-0 SH 27 (SUTURE) ×2
SUT VIC AB 2-0 SH 27X BRD (SUTURE) ×2 IMPLANT
SUT VICRYL 0 UR6 27IN ABS (SUTURE) ×4 IMPLANT
SUT VLOC BARB 180 ABS3/0GR12 (SUTURE) ×12
SUTURE VLOC BRB 180 ABS3/0GR12 (SUTURE) ×6 IMPLANT
SYR 27GX1/2 1ML LL SAFETY (SYRINGE) ×4 IMPLANT
TOWEL OR 17X26 10 PK STRL BLUE (TOWEL DISPOSABLE) ×4 IMPLANT
TOWEL OR NON WOVEN STRL DISP B (DISPOSABLE) ×4 IMPLANT
TROCAR 12M 150ML BLUNT (TROCAR) ×4 IMPLANT
WATER STERILE IRR 1500ML POUR (IV SOLUTION) ×8 IMPLANT

## 2015-03-13 NOTE — H&P (Signed)
Douglas Gay is an 69 y.o. male.    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 Bilateral lateral base and Lt apex positivity. TRUS 53mL. Pt' father with prostate cancer. He is minimally sexually active.    PMH sig for h/o DVT/coumadin, HLD, GERD . His PCP is Douglas Craver MD.  Today " Douglas Gay " is seen to proceed with robotic prostatecotmy with limited pelvic lymphadenectomy for primary management of his prostate cancer.    Past Medical History  Diagnosis Date  . History of DVT of lower extremity   . Arthritis   . History of gout   . GERD (gastroesophageal reflux disease)   . Prostate cancer   . History of kidney stones   . Hyperlipidemia   . Tinnitus     Past Surgical History  Procedure Laterality Date  . Prostate biopsy    . Tonsillectomy      No family history on file. Social History:  reports that he quit smoking about 12 years ago. He does not have any smokeless tobacco history on file. He reports that he drinks alcohol. He reports that he does not use illicit drugs.  Allergies: No Known Allergies  No prescriptions prior to admission    No results found for this or any previous visit (from the past 48 hour(s)). No results found.  Review of Systems  Constitutional: Negative.   HENT: Negative.   Eyes: Negative.   Respiratory: Negative.   Cardiovascular: Negative.   Gastrointestinal: Negative.   Genitourinary: Negative.   Musculoskeletal: Negative.   Skin: Negative.   Neurological: Negative.   Endo/Heme/Allergies: Negative.   Psychiatric/Behavioral: Negative.     There were no vitals taken for this visit. Physical Exam  HENT:  Head: Normocephalic.  Eyes: Pupils are equal, round, and reactive to light.  Neck: Normal range of motion.  Cardiovascular: Normal rate.   Respiratory: Effort normal.  GI: Soft.   Genitourinary:  No CVAT  Musculoskeletal: Normal range of motion.  Neurological: He is alert.  Skin: Skin is warm.  Psychiatric: He has a normal mood and affect. His behavior is normal. Judgment and thought content normal.     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 I showed the patient on their abdomen the approximately 6 small incision (trocar) sites as well as presumed extraction sites with robotic approach as well as possible open incision sites should open conversion be necessary. We rediscussed peri-operative risks including bleeding, infection, deep vein thrombosis, pulmonary embolism, compartment syndrome, nuropathy / neuropraxia, heart attack, stroke, death, as well as long-term risks such as non-cure / need for additional therapy. We specifically readdressed that the procedure would compromise urinary control leading to stress incontinence which typically resolves with time and pelvic rehabilitation (Kegel's, etc..), but can sometimes be permanent and require additional therapy including surgery. We also specifically addressed sexual sequellae including significant erectile dysfunction which typically partially resolves with time but can also be permanent and require additional therapy including surgery.   We rediscussed the typical hospital course including usual 1-2 night hospitalization, discharge with foley catheter in place usually for 1-2 weeks before voiding trial as well as usually 2 week recovery until able to perform most non-strenuous  activity and 6 weeks until able to return to most jobs and more strenuous activity such as exercise.   He has been off his coumadin, will place on proph Lovenox post-op.     Douglas Gay 03/13/2015, 6:31 AM

## 2015-03-13 NOTE — Anesthesia Postprocedure Evaluation (Signed)
  Anesthesia Post-op Note  Patient: Douglas Gay  Procedure(s) Performed: Procedure(s) (LRB): ROBOTIC ASSISTED LAPAROSCOPIC RADICAL PROSTATECTOMY (N/A) LIMITED BILATERAL PELVIC LYMPH NODE DISSECTION (Bilateral)  Patient Location: PACU  Anesthesia Type: General  Level of Consciousness: awake and alert   Airway and Oxygen Therapy: Patient Spontanous Breathing  Post-op Pain: mild  Post-op Assessment: Post-op Vital signs reviewed, Patient's Cardiovascular Status Stable, Respiratory Function Stable, Patent Airway and No signs of Nausea or vomiting  Last Vitals:  Filed Vitals:   03/13/15 1643  BP: 152/75  Pulse: 93  Temp: 36.6 C  Resp: 16    Post-op Vital Signs: stable   Complications: No apparent anesthesia complications

## 2015-03-13 NOTE — Anesthesia Preprocedure Evaluation (Signed)
Anesthesia Evaluation  Patient identified by MRN, date of birth, ID band Patient awake    Reviewed: Allergy & Precautions, NPO status , Patient's Chart, lab work & pertinent test results  Airway Mallampati: II  TM Distance: >3 FB Neck ROM: Full    Dental no notable dental hx.    Pulmonary former smoker,  breath sounds clear to auscultation  Pulmonary exam normal       Cardiovascular negative cardio ROS Normal cardiovascular examRhythm:Regular Rate:Normal     Neuro/Psych negative neurological ROS  negative psych ROS   GI/Hepatic Neg liver ROS, GERD-  ,  Endo/Other  negative endocrine ROS  Renal/GU negative Renal ROS     Musculoskeletal  (+) Arthritis -,   Abdominal   Peds  Hematology negative hematology ROS (+)   Anesthesia Other Findings   Reproductive/Obstetrics negative OB ROS                             Anesthesia Physical Anesthesia Plan  ASA: II  Anesthesia Plan: General   Post-op Pain Management:    Induction: Intravenous  Airway Management Planned: Oral ETT  Additional Equipment: None  Intra-op Plan:   Post-operative Plan: Extubation in OR  Informed Consent: I have reviewed the patients History and Physical, chart, labs and discussed the procedure including the risks, benefits and alternatives for the proposed anesthesia with the patient or authorized representative who has indicated his/her understanding and acceptance.   Dental advisory given  Plan Discussed with: CRNA  Anesthesia Plan Comments:         Anesthesia Quick Evaluation

## 2015-03-13 NOTE — Anesthesia Procedure Notes (Signed)
Procedure Name: Intubation Date/Time: 03/13/2015 12:46 PM Performed by: Montel Clock Pre-anesthesia Checklist: Patient identified, Emergency Drugs available, Suction available, Patient being monitored and Timeout performed Patient Re-evaluated:Patient Re-evaluated prior to inductionOxygen Delivery Method: Circle system utilized Preoxygenation: Pre-oxygenation with 100% oxygen Intubation Type: IV induction Ventilation: Mask ventilation without difficulty Laryngoscope Size: Mac and 3 Grade View: Grade II Tube type: Oral Tube size: 7.5 mm Number of attempts: 1 Airway Equipment and Method: Stylet Placement Confirmation: ETT inserted through vocal cords under direct vision,  positive ETCO2 and breath sounds checked- equal and bilateral Secured at: 23 cm Tube secured with: Tape Dental Injury: Teeth and Oropharynx as per pre-operative assessment

## 2015-03-13 NOTE — Transfer of Care (Signed)
Immediate Anesthesia Transfer of Care Note  Patient: Douglas Gay  Procedure(s) Performed: Procedure(s): ROBOTIC ASSISTED LAPAROSCOPIC RADICAL PROSTATECTOMY (N/A) LIMITED BILATERAL PELVIC LYMPH NODE DISSECTION (Bilateral)  Patient Location: PACU  Anesthesia Type:General  Level of Consciousness:  sedated, patient cooperative and responds to stimulation  Airway & Oxygen Therapy:Patient Spontanous Breathing and Patient connected to face mask oxgen  Post-op Assessment:  Report given to PACU RN and Post -op Vital signs reviewed and stable  Post vital signs:  Reviewed and stable  Last Vitals:  Filed Vitals:   03/13/15 1036  BP: 149/74  Pulse: 72  Temp: 36.9 C  Resp: 16    Complications: No apparent anesthesia complications

## 2015-03-13 NOTE — Brief Op Note (Signed)
03/13/2015  3:31 PM  PATIENT:  Douglas Gay  69 y.o. male  PRE-OPERATIVE DIAGNOSIS:  PROSTATE CANCER  POST-OPERATIVE DIAGNOSIS:  PROSTATE CANCER  PROCEDURE:  Procedure(s): ROBOTIC ASSISTED LAPAROSCOPIC RADICAL PROSTATECTOMY (N/A) LIMITED BILATERAL PELVIC LYMPH NODE DISSECTION (Bilateral)  SURGEON:  Surgeon(s) and Role:    * Alexis Frock, MD - Primary  PHYSICIAN ASSISTANT:   ASSISTANTS: Clemetine Marker, PA   ANESTHESIA:   general  EBL:  Total I/O In: 2000 [I.V.:2000] Out: 50 [Blood:50]  BLOOD ADMINISTERED:none  DRAINS: 1 - JP to bulb, 2 - foley to straight drain   LOCAL MEDICATIONS USED:  NONE  SPECIMEN:  Source of Specimen:  1 - bilateral pelvic lymph nodes, 2 - prostatectomy  DISPOSITION OF SPECIMEN:  PATHOLOGY  COUNTS:  YES  TOURNIQUET:  * No tourniquets in log *  DICTATION: .Other Dictation: Dictation Number [id number]  PLAN OF CARE: Admit to inpatient   PATIENT DISPOSITION:  PACU - hemodynamically stable.   Delay start of Pharmacological VTE agent (>24hrs) due to surgical blood loss or risk of bleeding: yes

## 2015-03-14 ENCOUNTER — Encounter (HOSPITAL_COMMUNITY): Payer: Self-pay | Admitting: Urology

## 2015-03-14 LAB — CREATININE, FLUID (PLEURAL, PERITONEAL, JP DRAINAGE): Creat, Fluid: 1.1 mg/dL

## 2015-03-14 LAB — BASIC METABOLIC PANEL
ANION GAP: 7 (ref 5–15)
BUN: 15 mg/dL (ref 6–20)
CO2: 23 mmol/L (ref 22–32)
Calcium: 8.8 mg/dL — ABNORMAL LOW (ref 8.9–10.3)
Chloride: 103 mmol/L (ref 101–111)
Creatinine, Ser: 1.08 mg/dL (ref 0.61–1.24)
GFR calc Af Amer: >60 mL/min (ref >60)
Glucose, Bld: 177 mg/dL — ABNORMAL HIGH (ref 65–99)
Potassium: 4 mmol/L (ref 3.5–5.1)
Sodium: 133 mmol/L — ABNORMAL LOW (ref 135–145)

## 2015-03-14 LAB — HEMOGLOBIN AND HEMATOCRIT, BLOOD
HEMATOCRIT: 35.8 % — AB (ref 39.0–52.0)
Hemoglobin: 11.9 g/dL — ABNORMAL LOW (ref 13.0–17.0)

## 2015-03-14 MED ORDER — SENNOSIDES-DOCUSATE SODIUM 8.6-50 MG PO TABS: 1.0000 | ORAL_TABLET | Freq: Two times a day (BID) | ORAL | Status: DC

## 2015-03-14 NOTE — Care Management Note (Signed)
Case Management Note  Patient Details  Name: Douglas Gay MRN: [id number] Date of Birth: Feb 17, 1946  Subjective/Objective:                    Action/Plan:d/c home no d/c needs or orders.  Expected Discharge Date:                  Expected Discharge Plan:  Home/Self Care  In-House Referral:     Discharge planning Services  CM Consult  Post Acute Care Choice:    Choice offered to:     DME Arranged:    DME Agency:     HH Arranged:    [place] Agency:     Status of Service:  Completed, signed off  Medicare Important Message Given:    Date Medicare IM Given:    Medicare IM give by:    Date Additional Medicare IM Given:    Additional Medicare Important Message give by:     If discussed at [place] of Stay Meetings, dates discussed:    Additional Comments:  Dessa Phi, RN 03/14/2015, 4:02 PM

## 2015-03-14 NOTE — Discharge Instructions (Signed)
1. Activity:  You are encouraged to ambulate frequently (about every hour during waking hours) to help prevent blood clots from forming in your legs or lungs.  However, you should not engage in any heavy lifting (> 10-15 lbs), strenuous activity, or straining. 2. Diet: You should continue a clear liquid diet until passing gas from below.  Once this occurs, you may advance your diet to a soft diet that would be easy to digest (i.e soups, scrambled eggs, mashed potatoes, etc.) for 24 hours just as you would if getting over a bad stomach flu.  If tolerating this diet well for 24 hours, you may then begin eating regular food.  It will be normal to have some amount of bloating, nausea, and abdominal discomfort intermittently. 3. Prescriptions:  You will be provided a prescription for pain medication to take as needed.  If your pain is not severe enough to require the prescription pain medication, you may take Tylenol instead.  You should also take an over the counter stool softener (Colace 100 mg twice daily) to avoid straining with bowel movements as the pain medication may constipate you. Finally, you will also be provided a prescription for an antibiotic to begin the day prior to your return visit in the office for catheter removal. 4. Catheter care: You will be taught how to take care of the catheter by the nursing staff prior to discharge from the hospital.  You may use both a leg bag and the larger bedside bag but it is recommended to at least use the bigger bedside bag at nighttime as the leg bag is small and will fill up overnight and also does not drain as well when lying flat. You may periodically feel a strong urge to void with the catheter in place.  This is a bladder spasm and most often can occur when having a bowel movement or when you are moving around. It is typically self-limited and usually will stop after a few minutes.  You may use some Vaseline or Neosporin around the tip of the catheter to  reduce friction at the tip of the penis. 5. Incisions: You may remove your dressing bandages the 2nd day after surgery.  You most likely will have a few small staples in each of the incisions and once the bandages are removed, the incisions may stay open to air.  You may start showering (not soaking or bathing in water) 48 hours after surgery and the incisions simply need to be patted dry after the shower.  No additional care is needed. 6. What to call us about: You should call the office [phone]) if you develop fever > 101, persistent vomiting, or the catheter stops draining. Also, feel free to call with any other questions you may have and remember the handout that was provided to you as a reference preoperatively which answers many of the common questions that arise after surgery. 7. You may resume coumadin, aspirin, advil, aleve, vitamins, and supplements 7 days after surgery.

## 2015-03-14 NOTE — Op Note (Signed)
NAMEZAIDE, KARDELL NO.:  [id number]  MEDICAL RECORD NO.:  [id number]  LOCATION:  41                         FACILITY:  [facility]  PHYSICIAN:  Alexis Frock, MD     DATE OF BIRTH:  Jul 24, 1946  DATE OF PROCEDURE: 03/13/2015                                OPERATIVE REPORT   DIAGNOSIS:  Large volume low-risk prostate cancer.  PROCEDURE: 1. Robotic-assisted laparoscopic radical prostatectomy. 2. Bilateral pelvic lymphadenectomy.  ASSISTANT:  Clemetine Marker, PA.  ESTIMATED BLOOD LOSS:  50 mL.  COMPLICATIONS:  None.  SPECIMEN: 1. Radical prostatectomy. 2. Right external iliac lymph nodes. 3. Right obturator lymph nodes. 4. Left external iliac lymph nodes. 5. Left obturator lymph nodes.  DRAINS: 1. Jackson-Pratt drain to bulb suction. 2. Foley catheter to straight drain.  FINDINGS: 1. Mild diverticulosis in descending colon without evidence of     diverticulitis. 2. Somewhat adherent prostate surrounding tissue apically, right     greater than left.  INDICATION:  Mr. 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  Options were discussed regarding management including radiotherapy versus surgical extirpation versus surveillance protocols, and he adamantly wished to proceed with surgical extirpation with minimally invasive assistance. Informed consent was obtained and placed in medical record.  PROCEDURE IN DETAIL:  The patient being Rai Severns verified. Procedure being robotic prostatectomy with lymphadenectomy was confirmed.  Procedure was carried out.  Time-out was performed. Intravenous antibiotics were administered.  General endotracheal anesthesia was induced.  The patient was placed into a supine position and he was further fashioned on  the operative table using 3-inch tape over foam padding across his chest.  A test of steep Trendelenburg positioning was performed and he was found to be suitably positioned. Sterile field was created by prepping and draping the patient's infra- xiphoid abdomen using chlorhexidine gluconate and his penis, perineum, and proximal thighs using iodine.  Foley catheter was placed per urethra to straight drain.  Next, a high-flow low-pressure pneumoperitoneum was obtained using Veress technique in the infraumbilical midline having passed the aspiration and drop test.  An 8 mm robotic camera port was placed into this same location.  Laparoscopic examination of the peritoneal cavity revealed no significant adhesions and no visceral injury.  There was mild diverticulosis noted of the sigmoid and descending colon without any evidence of diverticulitis.  Additional ports were placed as follows; right paramedian 8-mm robotic port, right far lateral 12-mm assistant port, right paramedian 5 mm assistant port, left paramedian 8-mm robotic port, left far lateral 8-mm robotic port. Robot was docked and passed through electronic checks.  Next, attention was directed at development of space of Retzius.  Incision was made lateral to the left medial umbilical ligament from the anterior abdominal wall to the area of the internal ring coursing along the iliac vessels towards the ureteral crossing, vas deferens encountered and ligated.  We used bucket-handle for gentle medial traction.  Dissection proceeded medial to the bladder towards the area of the  endopelvic fascia, which was carefully swept away from the prostate in a base to apex orientation.  Similarly, the right perivesical tissue and prostate were swept away from the sidewall and the right vas deferens was ligated.  Anterior testis was taken down using cautery scissors exposing anterior base of the prostate, which was defatted.  The periprostatic fat  set aside for permanent pathology.  Attention was directed at bilateral pelvic lymphadenectomy.  First, in the left side all fibrofatty tissue in the confines of the left external iliac artery, vein, pelvic side wall, and iliac bifurcation was mobilized. Lymphostasis was achieved with cold clips.  As such, I labeled left external iliac lymph nodes.  Next, all fibrofatty tissue in the confines of the left external iliac vein, obturator nerve, pelvic side wall were mobilized.  Lymphostasis was achieved with cold clips and set aside, labeled left obturator lymph nodes.  A mirror-imaged dissection was performed on the right side of the right external iliac and right obturator lymph nodes respectively.  Bilateral obturator nerves were inspected and following these maneuvers found to be intact.  The dorsal venous complex was then identified moving the Foley catheter back and forth and visualizing the dorsal vein, which was controlled using vascular load stapler taking great care to avoid membranous urethra injury, which did not occur.  Next, the bladder neck was separated from the prostate in the anterior to posterior direction taking great care to avoid excessive caliber bladder neck, this did not occur.  Posterior dissection was performed by incising approximately 7 mm inferior posteriorly to the posterior tail of the prostate injuring the plane of Denonvilliers.  Bilateral vas deferens was encountered, dissected for a distance of approximately 4 cm, ligated, and placed in gentle superior traction.  Bilateral seminal vesicles were dissected towards their tip and also placed on gentle superior traction.  Bilateral seminal vesicles larger than the right and felt to be just physiologic, this exposed the vascular pedicles bilaterally.  The vascular tissue was controlled in base to apex orientation using sequential clipping technique.  Partial nerve-sparing was performed on both sides sweeping the  presumed neurovascular tissue away from the posterolateral area of the prostate on its distal apical area.  Anterior dissection was performed which exposed the membranous urethra.  This was placed into gentle superior traction and transected distally given the apex positivity by biopsy. The apex area was somewhat adherent to surrounding tissue without obvious gross advanced disease, this was more prominent on the right side versus the left.  This completely freed up the prostatectomy specimen, it was placed into an EndoCatch bag for later retrieval. Posterior dissection was performed using a single V-Loc suture reapproximating the posterior urethral plate to the posterior bladder bringing the structures in a tension-free apposition.  Mucosa-to-mucosa anastomosis was performed using double-armed V-Loc suture from the 6 o'clock to 12 o'clock position ensuring excellent mucosa-to-mucosa anastomosis.  This anastomotic suture was then anchored to the puboprostatic ligament thus performing anterior reconstruction.  At this point, all sponge and needle counts were correct, a new Foley catheter was placed.  Easily 15 mL sterile water in the balloon.  Closed suction drain was brought through the previous left lateral most robotic port site in the area of the peritoneal cavity.  Previous right 12 mm assistant port site was closed at the level of fascia using [place] suture passer under laparoscopic vision.  Next, specimen was extracted by extending the previous camera port site inferiorly for a total distance of approximately 3  cm into the prostatectomy specimens, setting aside for permanent pathology.  This site was closed at the level of fascia using figure-of-eight PDS x3.  All incision sites were infiltrated with dilute lyophilized Marcaine.  This site was also reapproximated to the level of Scarpa's using running Vicryl and all skin incisions were reapproximated using  subcuticular Monocryl followed by Dermabond.  Procedure was then terminated.  The patient tolerated the procedure well.  There were no immediate periprocedural complications.  The patient was taken to the postanesthesia care unit in stable condition.          ______________________________ Alexis Frock, MD     TM/MEDQ  D:  03/13/2015  T:  03/14/2015  Job:  [id number]

## 2015-03-14 NOTE — Care Management Note (Signed)
Case Management Note  Patient Details  Name: Douglas Gay MRN: [id number] Date of Birth: 1946-09-25  Subjective/Objective: 69 yo M admitted with Prostate Cancer for Radical Prostatectomy. Pt is from Home.                 Action/Plan:Anticipate no discharge needs.    Expected Discharge Date:                  Expected Discharge Plan:  Home/Self Care  In-House Referral:     Discharge planning Services  CM Consult  Post Acute Care Choice:    Choice offered to:     DME Arranged:    DME Agency:     HH Arranged:    HH Agency:     Status of Service:  In process, will continue to follow  Medicare Important Message Given:    Date Medicare IM Given:    Medicare IM give by:    Date Additional Medicare IM Given:    Additional Medicare Important Message give by:     If discussed at [place] of Stay Meetings, dates discussed:    Additional Comments:  Dessa Phi, RN 03/14/2015, 2:42 PM

## 2015-03-14 NOTE — Discharge Summary (Signed)
Physician Discharge Summary  Patient ID: Douglas Gay MRN: [id number] DOB/AGE: 69-Oct-1947 69 y.o.  Admit date: 03/13/2015 Discharge date: 03/14/2015  Admission Diagnoses: Prostate Cancer  Discharge Diagnoses:  Active Problems:   Prostate cancer   Discharged Condition: good  Hospital Course:   1 - Large Volume Low Risk Prostate Cancer - s/p robotic prostatectomy with limited bilateral pelvic lymph node dissection on 03/13/15, the day of admission, without acute complications. Admitted post-op to 4th floor Urology service where he began his vigorous recovery. By the afternoon of POD 1 he is ambulatory, tollerating diet, pain controlled with PO meds, JP creatinine same as serum and removed, and felt to be adequate for discharge. Path pending at discharge.  Consults: None  Significant Diagnostic Studies: labs: Hgb >10, Cr <1.5 at discharge.  Treatments: surgery: robotic prostatectomy with limited bilateral pelvic lymph node dissection on 03/13/15  Discharge Exam: Blood pressure 130/61, pulse 83, temperature 98.4 F (36.9 C), temperature source Oral, resp. rate 18, height 5\' 11"  (1.803 m), weight 87.544 kg (193 lb), SpO2 99 %. General appearance: alert, cooperative, appears stated age and wife at bedside Head: Normocephalic, without obvious abnormality, atraumatic Eyes: negative Throat: lips, mucosa, and tongue normal; teeth and gums normal Neck: supple, symmetrical, trachea midline Back: symmetric, no curvature. ROM normal. No CVA tenderness. Resp: non-labored on room air Cardio: Nl rate GI: soft, non-tender; bowel sounds normal; no masses,  no organomegaly Male genitalia: normal, foley c/d/i with clear urine.  Extremities: extremities normal, atraumatic, no cyanosis or edema Pulses: 2+ and symmetric Skin: Skin color, texture, turgor normal. No rashes or lesions Lymph nodes: Cervical, supraclavicular, and axillary nodes normal. Neurologic: Grossly normal Incision/Wound: Recent  port and extraction sites c/d/i. JP site with dry dressing c/d/i.   Disposition: 01-Home or Self Care     Medication List    STOP taking these medications        cephALEXin 500 MG capsule  Commonly known as:  KEFLEX     diphenhydramine-acetaminophen 25-500 MG Tabs  Commonly known as:  TYLENOL PM     glucosamine-chondroitin 500-400 MG tablet     warfarin 5 MG tablet  Commonly known as:  COUMADIN      TAKE these medications        ciprofloxacin 500 MG tablet  Commonly known as:  CIPRO  Take 1 tablet (500 mg total) by mouth 2 (two) times daily. Start day prior to office visit for foley removal     COLCRYS 0.6 MG tablet  Generic drug:  colchicine  Take 2 tabs at onset of flare, can repeat with one tab in 1 hr.    May repeat in 24 hours     HYDROcodone-acetaminophen 5-325 MG per tablet  Commonly known as:  NORCO/VICODIN  Take 1-2 tablets by mouth every 6 (six) hours as needed.     hydrocortisone cream 1 %  Apply 1 application topically 2 (two) times daily. For rash on leg     omeprazole 40 MG capsule  Commonly known as:  PRILOSEC  Take 40 mg by mouth every morning.     psyllium 0.52 G capsule  Commonly known as:  REGULOID  Take 1.01 g by mouth every morning.     rosuvastatin 5 MG tablet  Commonly known as:  CRESTOR  Take 5 mg by mouth every morning.     senna-docusate 8.6-50 MG per tablet  Commonly known as:  Senokot-S  Take 1 tablet by mouth 2 (two) times daily. While taking pain  meds to prevent constipation     VISINE OP  Apply 1-2 drops to eye every morning.      ASK your doctor about these medications        enoxaparin 150 MG/ML injection  Commonly known as:  LOVENOX  Inject 0.56 mLs (85 mg total) into the skin every 12 (twelve) hours. For 5 days           Follow-up Information    Follow up with Alexis Frock, MD On 03/21/2015.   Specialty:  Urology   Why:  at 11:00   Contact information:   [place] [address] [phone]        Signed: Alexis Frock 03/14/2015, 3:48 PM

## 2015-03-14 NOTE — Progress Notes (Signed)
Went over all discharge information with pt and family.  All questions answered.  Explained importance of taking medications as prescribed and going to follow up appointment.  Went over leg bag and drainage bag teaching, used teach back to show pt understanding.  Prescriptions and summary given to pt wife.

## 2016-12-04 ENCOUNTER — Encounter (HOSPITAL_BASED_OUTPATIENT_CLINIC_OR_DEPARTMENT_OTHER): Payer: Self-pay | Admitting: *Deleted

## 2016-12-04 ENCOUNTER — Emergency Department (HOSPITAL_BASED_OUTPATIENT_CLINIC_OR_DEPARTMENT_OTHER)
Admission: EM | Admit: 2016-12-04 | Discharge: 2016-12-05 | Disposition: A | Discharge status: Home / Self Care | Payer: Medicare Other | Attending: Emergency Medicine | Admitting: Emergency Medicine

## 2016-12-04 DIAGNOSIS — Z87891 Personal history of nicotine dependence: Secondary | ICD-10-CM | POA: Insufficient documentation

## 2016-12-04 DIAGNOSIS — Z8546 Personal history of malignant neoplasm of prostate: Secondary | ICD-10-CM | POA: Diagnosis not present

## 2016-12-04 DIAGNOSIS — Z79899 Other long term (current) drug therapy: Secondary | ICD-10-CM | POA: Diagnosis not present

## 2016-12-04 DIAGNOSIS — N201 Calculus of ureter: Secondary | ICD-10-CM | POA: Diagnosis not present

## 2016-12-04 DIAGNOSIS — R109 Unspecified abdominal pain: Secondary | ICD-10-CM

## 2016-12-04 LAB — CBC
HCT: 40.7 % (ref 39.0–52.0)
Hemoglobin: 13.8 g/dL (ref 13.0–17.0)
MCH: 28.9 pg (ref 26.0–34.0)
MCHC: 33.9 g/dL (ref 30.0–36.0)
MCV: 85.3 fL (ref 78.0–100.0)
PLATELETS: 241 10*3/uL (ref 150–400)
RBC: 4.77 MIL/uL (ref 4.22–5.81)
RDW: 13.5 % (ref 11.5–15.5)
WBC: 10.9 10*3/uL — ABNORMAL HIGH (ref 4.0–10.5)

## 2016-12-04 LAB — URINALYSIS, ROUTINE W REFLEX MICROSCOPIC
BILIRUBIN URINE: NEGATIVE
Glucose, UA: NEGATIVE mg/dL
Ketones, ur: NEGATIVE mg/dL
LEUKOCYTES UA: NEGATIVE
Nitrite: NEGATIVE
PH: 5.5 (ref 5.0–8.0)
Protein, ur: NEGATIVE mg/dL
Specific Gravity, Urine: 1.03 (ref 1.005–1.030)

## 2016-12-04 LAB — BASIC METABOLIC PANEL
Anion gap: 8 (ref 5–15)
BUN: 21 mg/dL — AB (ref 6–20)
CALCIUM: 10 mg/dL (ref 8.9–10.3)
CO2: 23 mmol/L (ref 22–32)
CREATININE: 1.58 mg/dL — AB (ref 0.61–1.24)
Chloride: 107 mmol/L (ref 101–111)
GFR calc Af Amer: 49 mL/min — ABNORMAL LOW (ref >60)
GFR calc non Af Amer: 43 mL/min — ABNORMAL LOW (ref >60)
Glucose, Bld: 133 mg/dL — ABNORMAL HIGH (ref 65–99)
Potassium: 4.4 mmol/L (ref 3.5–5.1)
Sodium: 138 mmol/L (ref 135–145)

## 2016-12-04 LAB — URINALYSIS, MICROSCOPIC (REFLEX): WBC UA: NONE SEEN WBC/hpf (ref 0–5)

## 2016-12-04 MED ORDER — MORPHINE SULFATE (PF) 4 MG/ML IV SOLN
4.0000 mg | Freq: Once | INTRAVENOUS | Status: AC
  Administered 2016-12-05: 4 mg via INTRAVENOUS
  Filled 2016-12-04: qty 1

## 2016-12-04 MED ORDER — ONDANSETRON HCL 4 MG/2ML IJ SOLN
4.0000 mg | Freq: Once | INTRAMUSCULAR | Status: AC
  Administered 2016-12-05: 4 mg via INTRAVENOUS
  Filled 2016-12-04: qty 2

## 2016-12-04 MED ORDER — SODIUM CHLORIDE 0.9 % IV BOLUS (SEPSIS)
1000.0000 mL | Freq: Once | INTRAVENOUS | Status: AC
  Administered 2016-12-05: 1000 mL via INTRAVENOUS

## 2016-12-04 NOTE — ED Provider Notes (Signed)
[facility] Provider Note   CSN: [id number] Arrival date & time: 12/04/16  1957   By signing my name below, I, Soijett Blue, attest that this documentation has been prepared under the direction and in the presence of Burnard Hawthorne, PA-C Electronically Signed: Soijett Blue, ED Scribe. 12/04/16. 11:58 PM.  History   Chief Complaint Chief Complaint  Patient presents with  . Flank Pain    HPI Douglas Gay is a 71 y.o. male with a PMHx of kidney stones, prostate CA, who presents to the Emergency Department complaining of constant right flank pain radiating to right lower abdomen onset 9:30 AM. Pt reports associated urinary retention. He hasn't tried any medications for the relief for his symptoms. Pt notes that he has had 6 kidney stones in the past that he passed on his own without surgery. This feels very similar. He denies fever, nausea, vomiting, hematuria, and any other symptoms. Pt denies hx of kidney issues. Pt states that he has had his prostate removed, but he has all of his organs. Pt notes that he takes daily warfarin.   The history is provided by the patient. No language interpreter was used.    Past Medical History:  Diagnosis Date  . Arthritis   . GERD (gastroesophageal reflux disease)   . History of DVT of lower extremity   . History of gout   . History of kidney stones   . Hyperlipidemia   . Prostate cancer ([place])   . Tinnitus     Patient Active Problem List   Diagnosis Date Noted  . Prostate cancer ([place]) 03/13/2015    Past Surgical History:  Procedure Laterality Date  . LYMPHADENECTOMY Bilateral 03/13/2015   Procedure: LIMITED BILATERAL PELVIC LYMPH NODE DISSECTION;  Surgeon: Alexis Frock, MD;  Location: [facility];  Service: Urology;  Laterality: Bilateral;  . PROSTATE BIOPSY    . ROBOT ASSISTED LAPAROSCOPIC RADICAL PROSTATECTOMY N/A 03/13/2015   Procedure: ROBOTIC ASSISTED LAPAROSCOPIC RADICAL PROSTATECTOMY;  Surgeon: Alexis Frock, MD;   Location: [facility];  Service: Urology;  Laterality: N/A;  . TONSILLECTOMY         Home Medications    Prior to Admission medications   Medication Sig Start Date End Date Taking? Authorizing Provider  omeprazole (PRILOSEC) 40 MG capsule Take 40 mg by mouth every morning.   Yes Historical Provider, MD  rosuvastatin (CRESTOR) 5 MG tablet Take 5 mg by mouth every morning.    Yes Historical Provider, MD  WARFARIN SODIUM PO Take by mouth.   Yes Historical Provider, MD  ciprofloxacin (CIPRO) 500 MG tablet Take 1 tablet (500 mg total) by mouth 2 (two) times daily. Start day prior to office visit for foley removal 03/13/15   Debbrah Alar, PA-C  COLCRYS 0.6 MG tablet Take 2 tabs at onset of flare, can repeat with one tab in 1 hr.    May repeat in 24 hours Patient not taking: Reported on 02/27/2015 11/29/14   Dorie Rank, MD  HYDROcodone-acetaminophen (NORCO/VICODIN) 5-325 MG per tablet Take 1-2 tablets by mouth every 6 (six) hours as needed. 03/13/15   Debbrah Alar, PA-C  hydrocortisone cream 1 % Apply 1 application topically 2 (two) times daily. For rash on leg    Historical Provider, MD  psyllium (REGULOID) 0.52 G capsule Take 1.01 g by mouth every morning.    Historical Provider, MD  senna-docusate (SENOKOT-S) 8.6-50 MG per tablet Take 1 tablet by mouth 2 (two) times daily. While taking pain meds to prevent constipation  03/14/15   Alexis Frock, MD  Tetrahydrozoline HCl (VISINE OP) Apply 1-2 drops to eye every morning.    Historical Provider, MD    Family History No family history on file.  Social History Social History  Substance Use Topics  . Smoking status: Former Smoker    Quit date: 02/28/2003  . Smokeless tobacco: Never Used  . Alcohol use Yes     Comment: 2 x week     Allergies   Patient has no known allergies.   Review of Systems Review of Systems  Constitutional: Negative for chills and fever.  Gastrointestinal: Positive for abdominal pain. Negative for diarrhea, nausea and  vomiting.  Genitourinary: Positive for dysuria and flank pain. Negative for frequency, hematuria and urgency.  Neurological: Negative for weakness and headaches.  All other systems reviewed and are negative.    Physical Exam Updated Vital Signs BP 168/93 (BP Location: Right Arm)   Pulse 73   Temp 97.9 F (36.6 C) (Oral)   Resp 20   Ht 5\' 11"  (1.803 m)   Wt 185 lb (83.9 kg)   SpO2 99%   BMI 25.80 kg/m   Physical Exam  Constitutional: He is oriented to person, place, and time. He appears well-developed and well-nourished. No distress.  Non toxic appearing.  HENT:  Head: Normocephalic and atraumatic.  Eyes: EOM are normal.  Neck: Neck supple.  Cardiovascular: Normal rate, regular rhythm and normal heart sounds.  Exam reveals no gallop and no friction rub.   No murmur heard. Pulmonary/Chest: Effort normal and breath sounds normal. No respiratory distress. He has no wheezes. He has no rales.  Abdominal: Soft. Bowel sounds are normal. He exhibits no distension. There is tenderness in the right lower quadrant. There is CVA tenderness (right). There is no rigidity, no rebound and no guarding.  Musculoskeletal: Normal range of motion.  Neurological: He is alert and oriented to person, place, and time.  Skin: Skin is warm and dry. Capillary refill takes less than 2 seconds.  Psychiatric: He has a normal mood and affect. His behavior is normal.  Nursing note and vitals reviewed.    ED Treatments / Results  DIAGNOSTIC STUDIES: Oxygen Saturation is 99% on RA, nl by my interpretation.    COORDINATION OF CARE: 11:53 PM Discussed treatment plan with pt at bedside which includes labs, UA, CT renal stone study and pt agreed to plan.   Labs (all labs ordered are listed, but only abnormal results are displayed) Labs Reviewed  URINALYSIS, ROUTINE W REFLEX MICROSCOPIC - Abnormal; Notable for the following:       Result Value   Hgb urine dipstick MODERATE (*)    All other components  within normal limits  URINALYSIS, MICROSCOPIC (REFLEX) - Abnormal; Notable for the following:    Bacteria, UA RARE (*)    Squamous Epithelial / LPF 0-5 (*)    All other components within normal limits  BASIC METABOLIC PANEL - Abnormal; Notable for the following:    Glucose, Bld 133 (*)    BUN 21 (*)    Creatinine, Ser 1.58 (*)    GFR calc non Af Amer 43 (*)    GFR calc Af Amer 49 (*)    All other components within normal limits  CBC - Abnormal; Notable for the following:    WBC 10.9 (*)    All other components within normal limits    Radiology Ct Renal Stone Study  Result Date: 12/05/2016 CLINICAL DATA:  71 y/o  M; right  flank pain. EXAM: CT ABDOMEN AND PELVIS WITHOUT CONTRAST TECHNIQUE: Multidetector CT imaging of the abdomen and pelvis was performed following the standard protocol without IV contrast. COMPARISON:  12/28/2008 CT abdomen and pelvis. FINDINGS: Lower chest: 11 mm ground-glass nodule in the left upper lobe lingula, present in 2010. Moderate coronary artery calcification. Hepatobiliary: No focal liver lesion. Gallstones. No intra or extrahepatic biliary ductal dilatation. Pancreas: Unremarkable. No pancreatic ductal dilatation or surrounding inflammatory changes. Spleen: Stable 12 mm lucency within the spleen is stable consistent with benign etiology. Adrenals/Urinary Tract: Normal adrenal glands. Punctate left kidney interpolar stone. No left kidney hydronephrosis. Right kidney interpolar stable cyst. Right kidney lower pole nonobstructing punctate stone. Moderate right-sided hydronephrosis and perinephric stranding secondary to a 4 mm stone at the right ureterovesicular junction (series 2, image 86). Normal bladder. Stomach/Bowel: Stomach is within normal limits. Appendix appears normal. No evidence of bowel wall thickening, distention, or inflammatory changes. Extensive sigmoid diverticulosis. Vascular/Lymphatic: Aortic atherosclerosis. No enlarged abdominal or pelvic lymph nodes.  Reproductive: Status post prostatectomy. Other: Small right inguinal hernia containing fat. Small paraumbilical hernia containing fat. No ascites. Musculoskeletal: Lumbar degenerative changes greatest at the L5-S1 level with there is severe disc space narrowing. IMPRESSION: 1. Moderate right hydronephrosis secondary to obstructing stone measuring 4 mm at the right ureterovesicular junction. 2. Bilateral nonobstructing punctate kidney stones. 3. Gallstones. 4. Extensive sigmoid diverticulosis. 5. Small right inguinal and paraumbilical hernias containing fat. Electronically Signed   By: Kristine Garbe M.D.   On: 12/05/2016 00:38    Procedures Procedures (including critical care time)  Medications Ordered in ED Medications  morphine 4 MG/ML injection 4 mg (4 mg Intravenous Given 12/05/16 0004)  ondansetron (ZOFRAN) injection 4 mg (4 mg Intravenous Given 12/05/16 0005)  sodium chloride 0.9 % bolus 1,000 mL (0 mLs Intravenous Stopped 12/05/16 0112)  HYDROcodone-acetaminophen (NORCO/VICODIN) 5-325 MG per tablet 1 tablet (1 tablet Oral Given 12/05/16 0111)  tamsulosin (FLOMAX) capsule 0.4 mg (0.4 mg Oral Given 12/05/16 0111)     Initial Impression / Assessment and Plan / ED Course  I have reviewed the triage vital signs and the nursing notes.  Pertinent labs & imaging results that were available during my care of the patient were reviewed by me and considered in my medical decision making (see chart for details).     Pt presents to the ED with right flank pain onset pta. Hx of kidney stones and this feels similar. UA with moderate amount of hgb.No signs of infection. CT renal study showed  moderate right hydronephrosis secondary to obstructing stone measuring 4 mm at the right ureterovesicular junction. Incidental finding of bilateral nonobstructing punctate kidney stones and gallstone noted without any gallbladder thickening. Pt informed of findings. Creatine slightly elevated at 1.5. Last  creatine in 06/2016 was 1.3.  There is no evidence of significant hydronephrosis, vitals sign stable and the pt does not have irratractable vomiting. pt is afebrile and no sig leukocytosis. Pain was controlled in the ED. Will not give tordol given elevated creatine. Pt will be dc home with pain medications, nausea meds, and flomax & has been advised to follow up with his urologist this week. He has a strainer at home. Pt was discussed with Dr. Florina Ou who is agreeable to the above plan. Pt is hemodynamically stable, in NAD, & able to ambulate in the ED. Pain has been managed & has no complaints prior to dc. Pt is comfortable with above plan and is stable for discharge at this time. All questions  were answered prior to disposition. Strict return precautions for f/u to the ED were discussed.    Final Clinical Impressions(s) / ED Diagnoses   Final diagnoses:  Right flank pain  Ureterolithiasis    New Prescriptions Discharge Medication List as of 12/05/2016  1:05 AM    START taking these medications   Details  ondansetron (ZOFRAN) 4 MG tablet Take 1 tablet (4 mg total) by mouth every 6 (six) hours., Starting Sat 12/05/2016, Print    tamsulosin (FLOMAX) 0.4 MG CAPS capsule Take 1 capsule (0.4 mg total) by mouth daily., Starting Sat 12/05/2016, Print      I personally performed the services described in this documentation, which was scribed in my presence. The recorded information has been reviewed and is accurate.     Doristine Devoid, PA-C 12/06/16 1439    Shanon Rosser, MD 12/07/16 [phone]

## 2016-12-04 NOTE — ED Triage Notes (Signed)
Right flank pain since 9:30am. Hx of kidney stones.

## 2016-12-05 ENCOUNTER — Emergency Department (HOSPITAL_BASED_OUTPATIENT_CLINIC_OR_DEPARTMENT_OTHER): Payer: Medicare Other

## 2016-12-05 MED ORDER — TAMSULOSIN HCL 0.4 MG PO CAPS
0.4000 mg | ORAL_CAPSULE | Freq: Once | ORAL | Status: AC
  Administered 2016-12-05: 0.4 mg via ORAL
  Filled 2016-12-05: qty 1

## 2016-12-05 MED ORDER — HYDROCODONE-ACETAMINOPHEN 5-325 MG PO TABS: 1.0000 | ORAL_TABLET | ORAL | 0 refills | Status: DC | PRN

## 2016-12-05 MED ORDER — ONDANSETRON HCL 4 MG PO TABS: 4.0000 mg | ORAL_TABLET | Freq: Four times a day (QID) | ORAL | 0 refills | Status: DC

## 2016-12-05 MED ORDER — HYDROCODONE-ACETAMINOPHEN 5-325 MG PO TABS
1.0000 | ORAL_TABLET | Freq: Once | ORAL | Status: AC
  Administered 2016-12-05: 1 via ORAL
  Filled 2016-12-05: qty 1

## 2016-12-05 MED ORDER — TAMSULOSIN HCL 0.4 MG PO CAPS: 0.4000 mg | ORAL_CAPSULE | Freq: Every day | ORAL | 0 refills | Status: DC

## 2016-12-05 NOTE — Discharge Instructions (Signed)
You do have a 4 mm kidney stone on the right side. This will likely pass on its own. Use your strainer at home. Take the pain medicine as needed. Take nausea medicine as needed. Take the Flomax daily. Drink plenty or watr. Return to the ED if he develops any fevers. Follow-up with urologist next week to her kidney function rechecked. He also had a few gallstones seen a year CT. Follow-up with primary care doctor as needed.

## 2017-11-17 IMAGING — CT CT RENAL STONE PROTOCOL
2 of 4 series · 16 of 46 positions shown, 18 images · non-contrast
Comparison: 12/28/2008 CT abdomen and pelvis.

CLINICAL DATA: 70 y/o  M; right flank pain.

EXAM:
CT ABDOMEN AND PELVIS WITHOUT CONTRAST
TECHNIQUE: Multidetector CT imaging of the abdomen and pelvis was performed
following the standard protocol without IV contrast.

[Series 2: axial st · axial · 0.87mm/px · z∈[+741,+1191]mm · 13 of 100 slices shown, 15 images]
[im 5/100  soft-tissue]
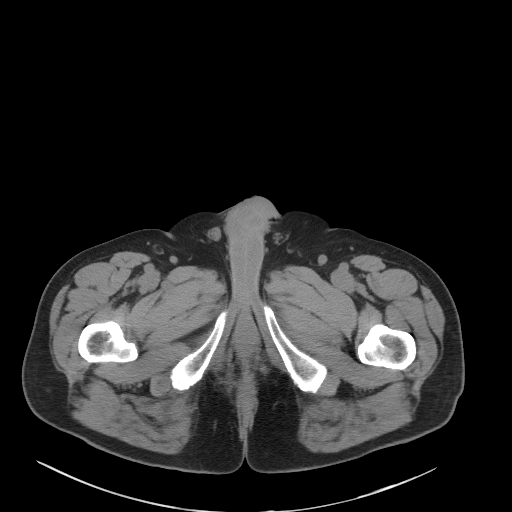
[im 5/100  bone]
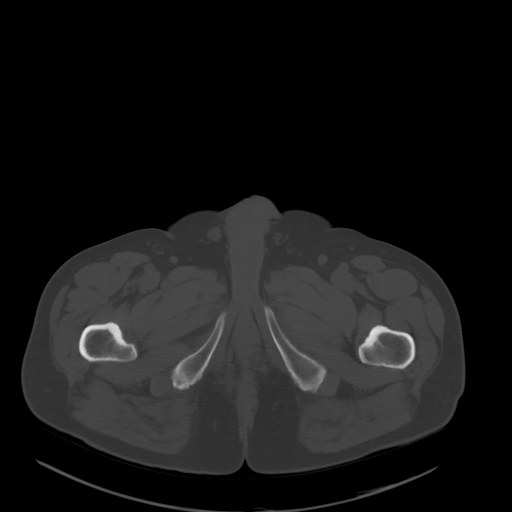
[im 13/100  soft-tissue]
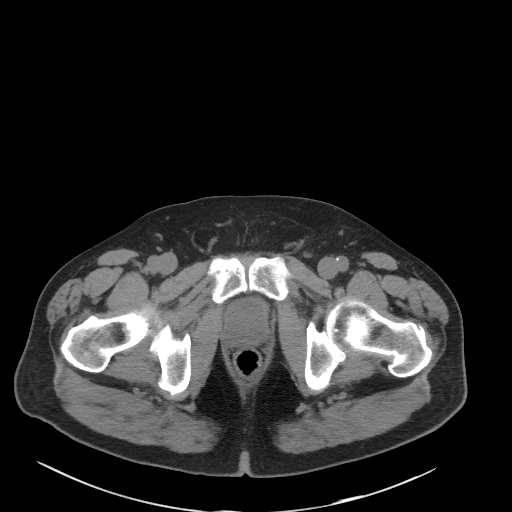
[im 21/100  soft-tissue]
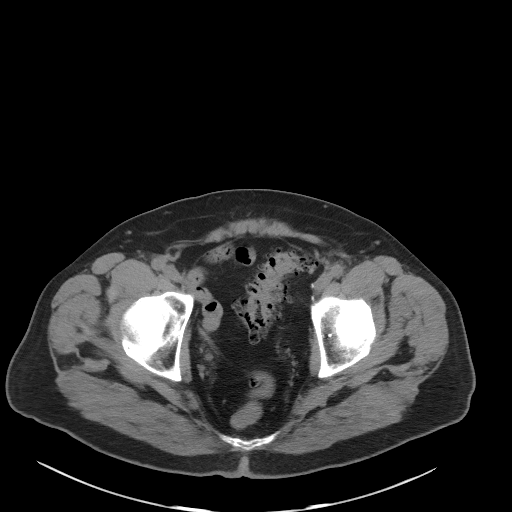
[im 29/100  soft-tissue]
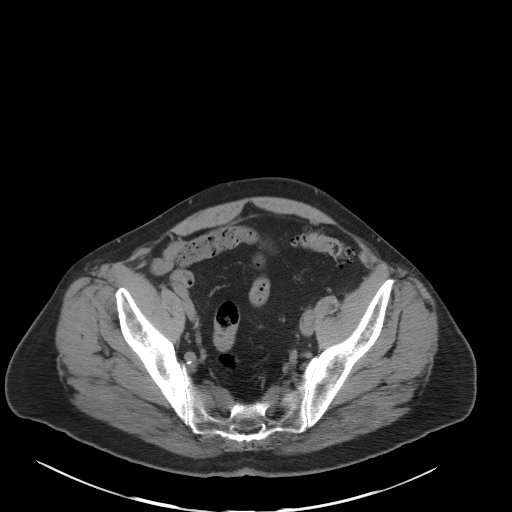
[im 34/100  soft-tissue]
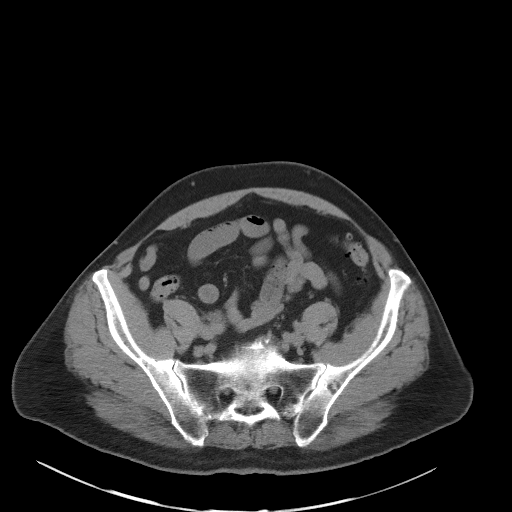
[im 42/100  soft-tissue]
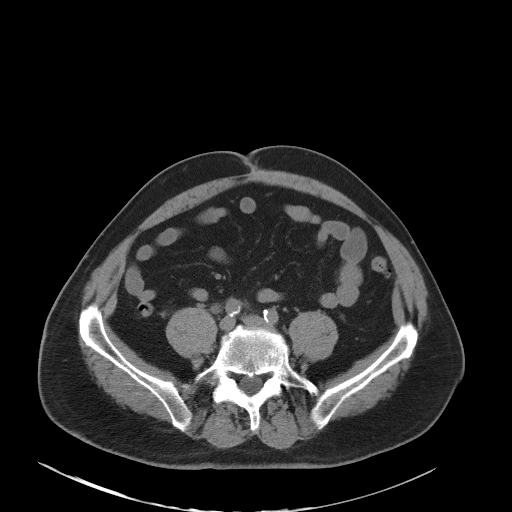
[im 50/100  soft-tissue]
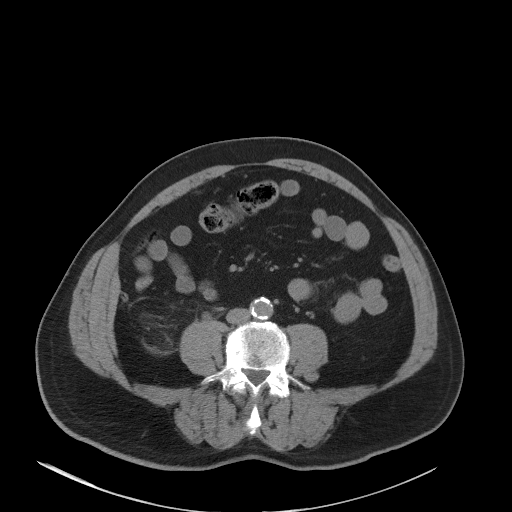
[im 58/100  soft-tissue]
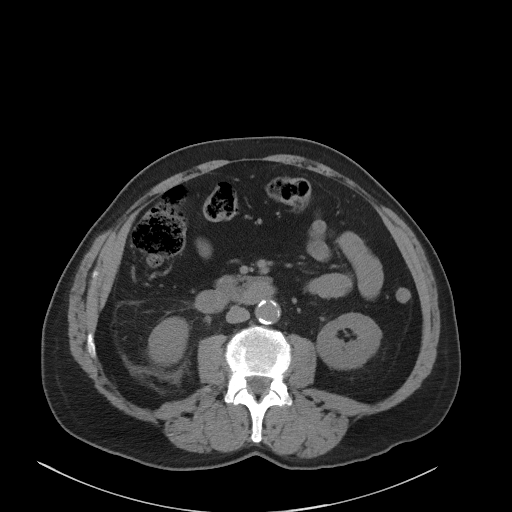
[im 67/100  soft-tissue]
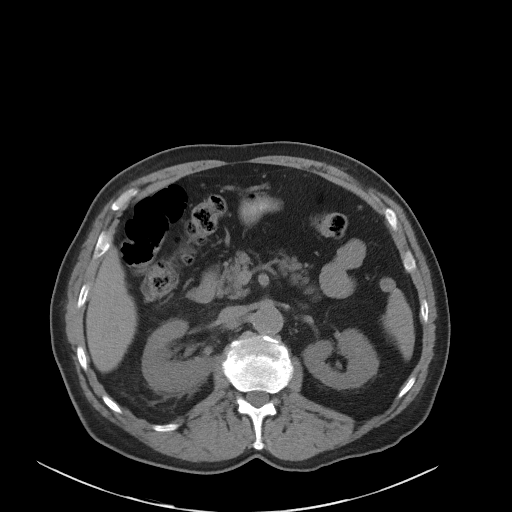
[im 67/100  bone]
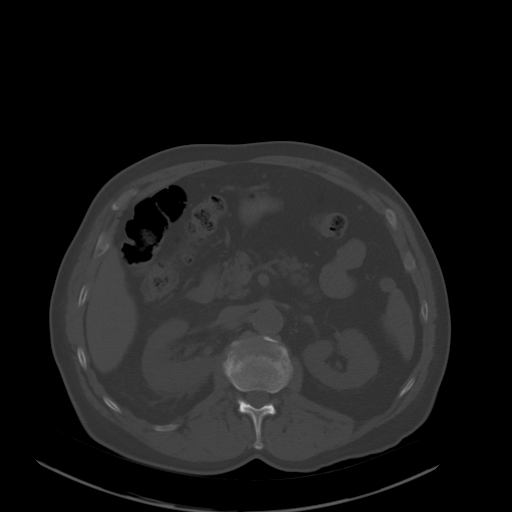
[im 71/100  soft-tissue]
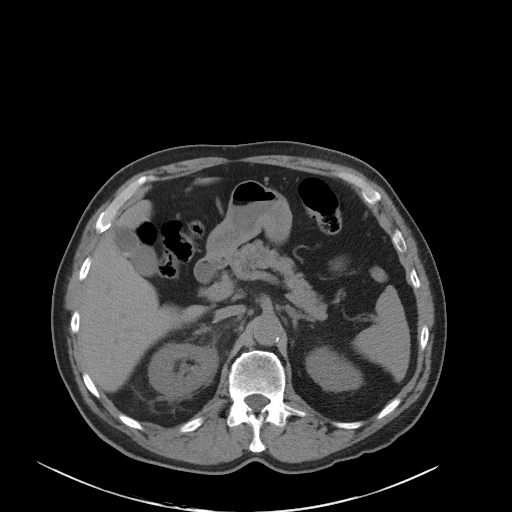
[im 79/100  soft-tissue]
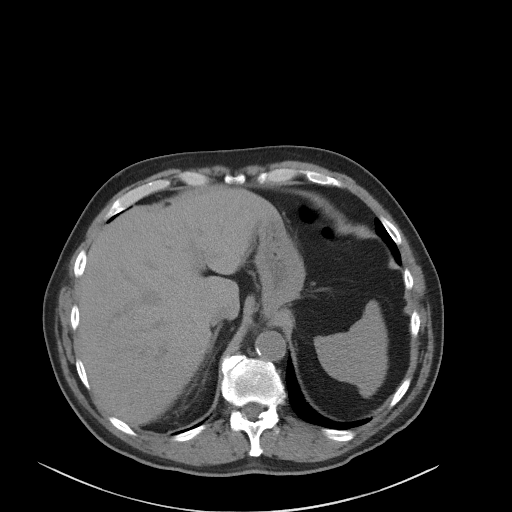
[im 87/100  soft-tissue]
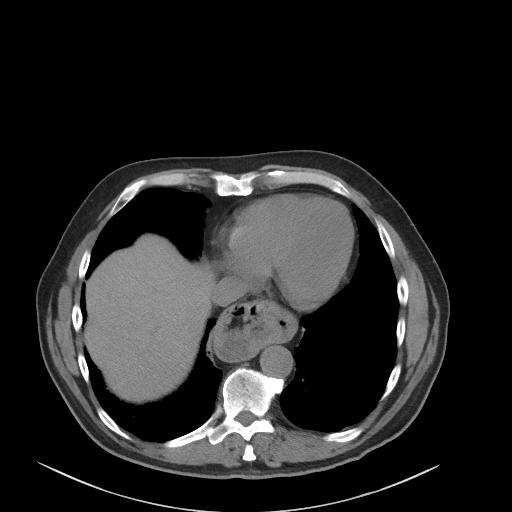
[im 95/100  soft-tissue]
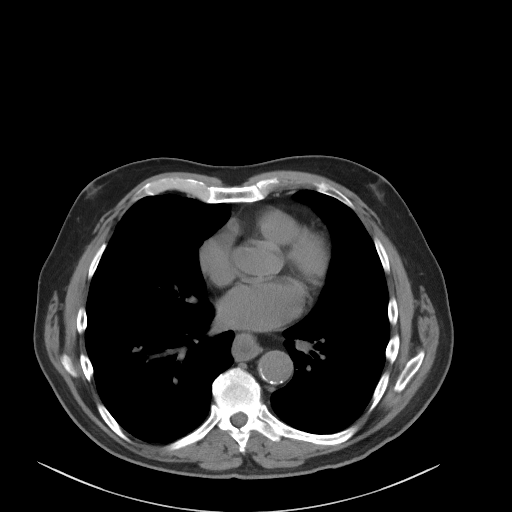

[Series 4: coronal st · coronal · 0.81mm/px · 3 of 107 slices shown]
[im 36/107  soft-tissue]
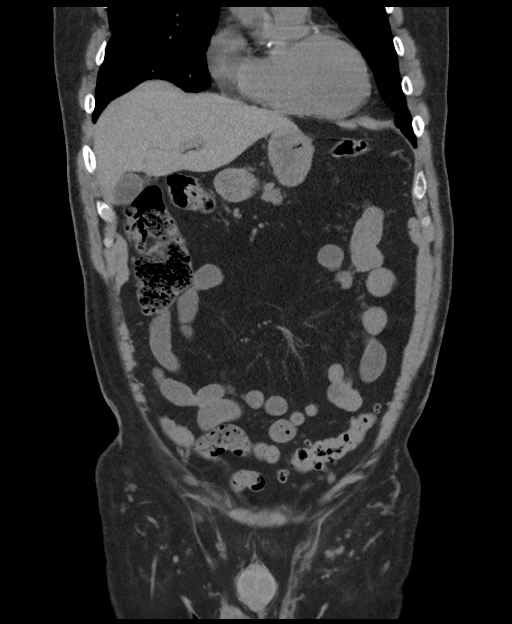
[im 48/107  soft-tissue]
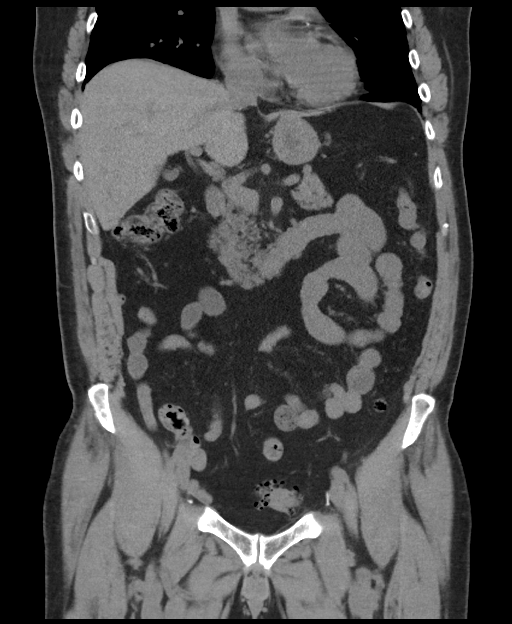
[im 59/107  soft-tissue]
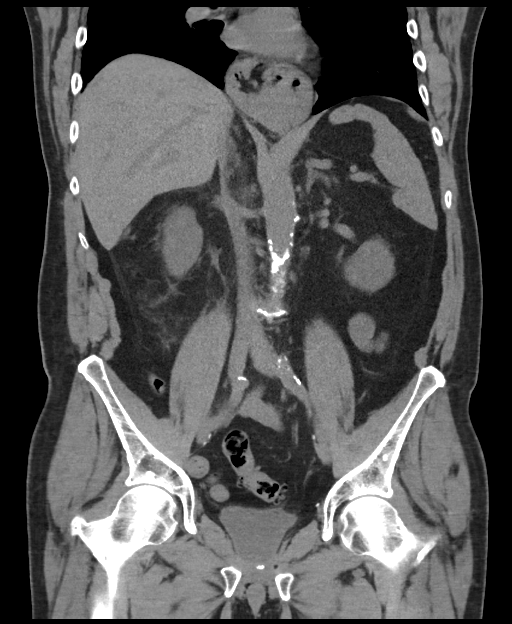

[16 of 46 positions shown; findings below may reference images not displayed]

FINDINGS: Lower chest: 11 mm ground-glass nodule in the left upper lobe
lingula, present in 4111. Moderate coronary artery calcification.

Hepatobiliary: No focal liver lesion. Gallstones. No intra or
extrahepatic biliary ductal dilatation.

Pancreas: Unremarkable. No pancreatic ductal dilatation or
surrounding inflammatory changes.

Spleen: Stable 12 mm lucency within the spleen is stable consistent
with benign etiology.

Adrenals/Urinary Tract: Normal adrenal glands. Punctate left kidney
interpolar stone. No left kidney hydronephrosis. Right kidney
interpolar stable cyst. Right kidney lower pole nonobstructing
punctate stone. Moderate right-sided hydronephrosis and perinephric
stranding secondary to a 4 mm stone at the right ureterovesicular
junction (series 2, image 86). Normal bladder.

Stomach/Bowel: Stomach is within normal limits. Appendix appears
normal. No evidence of bowel wall thickening, distention, or
inflammatory changes. Extensive sigmoid diverticulosis.

Vascular/Lymphatic: Aortic atherosclerosis. No enlarged abdominal or
pelvic lymph nodes.

Reproductive: Status post prostatectomy.

Other: Small right inguinal hernia containing fat. Small
paraumbilical hernia containing fat. No ascites.

Musculoskeletal: Lumbar degenerative changes greatest at the L5-S1
level with there is severe disc space narrowing.
IMPRESSION: 1. Moderate right hydronephrosis secondary to obstructing stone
measuring 4 mm at the right ureterovesicular junction.
2. Bilateral nonobstructing punctate kidney stones.
3. Gallstones.
4. Extensive sigmoid diverticulosis.
5. Small right inguinal and paraumbilical hernias containing fat.

By: Abdulfatai Taura M.D.

## 2018-10-26 HISTORY — PX: ANGIOPLASTY: SHX39

## 2019-08-10 ENCOUNTER — Ambulatory Visit: Payer: Self-pay | Admitting: Podiatry

## 2020-11-18 NOTE — Patient Instructions (Addendum)
DUE TO COVID-19 ONLY ONE VISITOR IS ALLOWED TO COME WITH YOU AND STAY IN THE WAITING ROOM ONLY DURING PRE OP AND PROCEDURE DAY OF SURGERY. THE 1 VISITOR  MAY VISIT WITH YOU AFTER SURGERY IN YOUR PRIVATE ROOM DURING VISITING HOURS ONLY!  YOU NEED TO HAVE A COVID 19 TEST ON_1/29______ @_10 :05______, THIS TEST MUST BE DONE BEFORE SURGERY,  COVID TESTING SITE [place] [id number], IT IS ON THE RIGHT GOING OUT [address] APPROXIMATELY  2 MINUTES PAST [employer] ON THE RIGHT. ONCE YOUR COVID TEST IS COMPLETED,  PLEASE BEGIN THE QUARANTINE INSTRUCTIONS AS OUTLINED IN YOUR HANDOUT.                Philbert Riser   Your procedure is scheduled on:    Report to [facility] Main  Entrance   Report to admitting at  6:00 AM     Call this number if you have problems the morning of surgery [phone]    Remember: Do not eat food or drink liquids :After Midnight.   BRUSH YOUR TEETH MORNING OF SURGERY AND RINSE YOUR MOUTH OUT, NO CHEWING GUM CANDY OR MINTS.  No food after midnight  .  You may have clear liquid until 4:30 AM.   Nothing by mouth after 4:30 AM.    Take these medicines the morning of surgery with A SIP OF WATER: Lexapro, Amlodipine, Omeprazole                                 You may not have any metal on your body including              piercings  Do not wear jewelry, lotions, powders or deodorant                       Men may shave face and neck.   Do not bring valuables to the hospital. [place].  Contacts, dentures or bridgework may not be worn into surgery.       Patients discharged the day of surgery will not be allowed to drive home  . IF YOU ARE HAVING SURGERY AND GOING HOME THE SAME DAY, YOU MUST HAVE AN ADULT TO DRIVE YOU HOME AND BE WITH YOU FOR 24 HOURS.  YOU MAY GO HOME BY TAXI OR UBER OR ORTHERWISE, BUT AN ADULT MUST ACCOMPANY YOU HOME AND STAY WITH YOU FOR 24  HOURS.               Please read over the following fact sheets you were given: _____________________________________________________________________             [facility] - Preparing for Surgery Before surgery, you can play an important role .  Because skin is not sterile, your skin needs to be as free of germs as possible.   You can reduce the number of germs on your skin by washing with CHG (chlorahexidine gluconate) soap before surgery.   CHG is an antiseptic cleaner which kills germs and bonds with the skin to continue killing germs even after washing. Please DO NOT use if you have an allergy to CHG or antibacterial soaps.   If your skin becomes reddened/irritated stop using the CHG and inform your nurse when you arrive at [facility].  You may shave your face/neck.  Please follow these instructions carefully:  1.  Shower with CHG Soap the night before surgery and the  morning of Surgery.  2.  If you choose to wash your hair, wash your hair first as usual with your  normal  shampoo.  3.  After you shampoo, rinse your hair and body thoroughly to remove the  shampoo.                                        4.  Use CHG as you would any other liquid soap.  You can apply chg directly  to the skin and wash                       Gently with a scrungie or clean washcloth.  5.  Apply the CHG Soap to your body ONLY FROM THE NECK DOWN.   Do not use on face/ open                           Wound or open sores. Avoid contact with eyes, ears mouth and genitals (private parts).                       Wash face,  Genitals (private parts) with your normal soap.             6.  Wash thoroughly, paying special attention to the area where your surgery  will be performed.  7.  Thoroughly rinse your body with warm water from the neck down.  8.  DO NOT shower/wash with your normal soap after using and rinsing off  the CHG Soap.             9.  Pat yourself dry with a clean towel.            10.  Wear clean  pajamas.            11.  Place clean sheets on your bed the night of your first shower and do not  sleep with pets. Day of Surgery : Do not apply any lotions/deodorants the morning of surgery.  Please wear clean clothes to the hospital/surgery center.  FAILURE TO FOLLOW THESE INSTRUCTIONS MAY RESULT IN THE CANCELLATION OF YOUR SURGERY PATIENT SIGNATURE_________________________________  NURSE SIGNATURE__________________________________  ________________________________________________________________________   Adam Phenix  An incentive spirometer is a tool that can help keep your lungs clear and active. This tool measures how well you are filling your lungs with each breath. Taking long deep breaths may help reverse or decrease the chance of developing breathing (pulmonary) problems (especially infection) following:  A long period of time when you are unable to move or be active. BEFORE THE PROCEDURE   If the spirometer includes an indicator to show your best effort, your nurse or respiratory therapist will set it to a desired goal.  If possible, sit up straight or lean slightly forward. Try not to slouch.  Hold the incentive spirometer in an upright position. INSTRUCTIONS FOR USE  1. Sit on the edge of your bed if possible, or sit up as far as you can in bed or on a chair. 2. Hold the incentive spirometer in an upright position. 3. Breathe out normally. 4. Place the mouthpiece in your mouth and seal your lips  tightly around it. 5. Breathe in slowly and as deeply as possible, raising the piston or the ball toward the top of the column. 6. Hold your breath for 3-5 seconds or for as long as possible. Allow the piston or ball to fall to the bottom of the column. 7. Remove the mouthpiece from your mouth and breathe out normally. 8. Rest for a few seconds and repeat Steps 1 through 7 at least 10 times every 1-2 hours when you are awake. Take your time and take a few normal breaths  between deep breaths. 9. The spirometer may include an indicator to show your best effort. Use the indicator as a goal to work toward during each repetition. 10. After each set of 10 deep breaths, practice coughing to be sure your lungs are clear. If you have an incision (the cut made at the time of surgery), support your incision when coughing by placing a pillow or rolled up towels firmly against it. Once you are able to get out of bed, walk around indoors and cough well. You may stop using the incentive spirometer when instructed by your caregiver.  RISKS AND COMPLICATIONS  Take your time so you do not get dizzy or light-headed.  If you are in pain, you may need to take or ask for pain medication before doing incentive spirometry. It is harder to take a deep breath if you are having pain. AFTER USE  Rest and breathe slowly and easily.  It can be helpful to keep track of a log of your progress. Your caregiver can provide you with a simple table to help with this. If you are using the spirometer at home, follow these instructions: [place] IF:   You are having difficultly using the spirometer.  You have trouble using the spirometer as often as instructed.  Your pain medication is not giving enough relief while using the spirometer.  You develop fever of 100.5 F (38.1 C) or higher. SEEK IMMEDIATE MEDICAL CARE IF:   You cough up bloody sputum that had not been present before.  You develop fever of 102 F (38.9 C) or greater.  You develop worsening pain at or near the incision site. MAKE SURE YOU:   Understand these instructions.  Will watch your condition.  Will get help right away if you are not doing well or get worse. Document Released: 02/22/2007 Document Revised: 01/04/2012 Document Reviewed: 04/25/2007 [facility] Patient Information 2014 [place], [place].   ________________________________________________________________________

## 2020-11-19 ENCOUNTER — Encounter (HOSPITAL_COMMUNITY)
Admission: RE | Admit: 2020-11-19 | Discharge: 2020-11-19 | Disposition: A | Discharge status: Home / Self Care | Payer: Medicare Other | Source: Ambulatory Visit | Attending: Specialist | Admitting: Specialist

## 2020-11-19 ENCOUNTER — Encounter (HOSPITAL_COMMUNITY): Payer: Self-pay

## 2020-11-19 ENCOUNTER — Other Ambulatory Visit: Payer: Self-pay

## 2020-11-19 DIAGNOSIS — Z01812 Encounter for preprocedural laboratory examination: Secondary | ICD-10-CM | POA: Diagnosis present

## 2020-11-19 HISTORY — DX: Peripheral vascular disease, unspecified: I73.9

## 2020-11-19 LAB — BASIC METABOLIC PANEL
Anion gap: 6 (ref 5–15)
BUN: 23 mg/dL (ref 8–23)
CO2: 27 mmol/L (ref 22–32)
Calcium: 9.6 mg/dL (ref 8.9–10.3)
Chloride: 107 mmol/L (ref 98–111)
Creatinine, Ser: 1.42 mg/dL — ABNORMAL HIGH (ref 0.61–1.24)
GFR, Estimated: 52 mL/min — ABNORMAL LOW (ref >60)
Glucose, Bld: 106 mg/dL — ABNORMAL HIGH (ref 70–99)
Potassium: 4.3 mmol/L (ref 3.5–5.1)
Sodium: 140 mmol/L (ref 135–145)

## 2020-11-19 LAB — CBC
HCT: 39.7 % (ref 39.0–52.0)
Hemoglobin: 13 g/dL (ref 13.0–17.0)
MCH: 30 pg (ref 26.0–34.0)
MCHC: 32.7 g/dL (ref 30.0–36.0)
MCV: 91.7 fL (ref 80.0–100.0)
Platelets: 253 10*3/uL (ref 150–400)
RBC: 4.33 MIL/uL (ref 4.22–5.81)
RDW: 13.1 % (ref 11.5–15.5)
WBC: 4.8 10*3/uL (ref 4.0–10.5)
nRBC: 0 % (ref 0.0–0.2)

## 2020-11-19 LAB — SURGICAL PCR SCREEN
MRSA, PCR: NEGATIVE
Staphylococcus aureus: POSITIVE — AB

## 2020-11-19 NOTE — Progress Notes (Signed)
COVID Vaccine Completed:yes Date COVID Vaccine completed:11/2019-booster 06/2020 COVID vaccine manufacturer: Pfizer     PCP - Dr. Shirley Friar Cardiologist - Dr. Marissa Nestle  Chest x-ray - no EKG - requested 11/19/20 for EKG done on 09/03/20 Stress Test - no ECHO - no Cardiac Cath - no Pacemaker/ICD device last checked:NA  Sleep Study - no CPAP -   Fasting Blood Sugar - NA Checks Blood Sugar _____ times a day  Blood Thinner Instructions:ASA and Xarelto/Dr. Jeralene Huff for Leiden factor V Aspirin Instructions:stop ASA 5 days prior to DOS. Stop Xarelto 2 days prior to DOS Last Dose:11/21/20, 11/24/20  Anesthesia review:   Patient denies shortness of breath, fever, cough and chest pain at PAT appointment  yes Patient verbalized understanding of instructions that were given to them at the PAT appointment. Patient was also instructed that they will need to review over the PAT instructions again at home before surgery.Yes Pt reports no SOB climbing stairs, doing housework or with ADLs. 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

## 2020-11-23 ENCOUNTER — Other Ambulatory Visit (HOSPITAL_COMMUNITY): Payer: Medicare Other

## 2020-11-27 ENCOUNTER — Ambulatory Visit (HOSPITAL_COMMUNITY): Admission: RE | Admit: 2020-11-27 | Payer: Medicare Other | Source: Home / Self Care | Admitting: Specialist

## 2020-11-27 ENCOUNTER — Encounter (HOSPITAL_COMMUNITY): Admission: RE | Payer: Self-pay | Source: Home / Self Care

## 2020-11-27 SURGERY — ARTHROPLASTY, KNEE, TOTAL
Anesthesia: Spinal | Site: Knee | Laterality: Left

## 2020-12-25 ENCOUNTER — Encounter (HOSPITAL_COMMUNITY): Payer: Self-pay

## 2020-12-25 ENCOUNTER — Other Ambulatory Visit: Payer: Self-pay

## 2020-12-25 ENCOUNTER — Encounter (HOSPITAL_COMMUNITY)
Admission: RE | Admit: 2020-12-25 | Discharge: 2020-12-25 | Disposition: A | Discharge status: Home / Self Care | Payer: Medicare Other | Source: Ambulatory Visit | Attending: Specialist | Admitting: Specialist

## 2020-12-25 DIAGNOSIS — Z01812 Encounter for preprocedural laboratory examination: Secondary | ICD-10-CM | POA: Insufficient documentation

## 2020-12-25 HISTORY — DX: Anxiety disorder, unspecified: F41.9

## 2020-12-25 HISTORY — DX: Depression, unspecified: F32.A

## 2020-12-25 HISTORY — DX: Chronic kidney disease, unspecified: N18.9

## 2020-12-25 HISTORY — DX: Essential (primary) hypertension: I10

## 2020-12-25 LAB — BASIC METABOLIC PANEL
Anion gap: 7 (ref 5–15)
BUN: 23 mg/dL (ref 8–23)
CO2: 24 mmol/L (ref 22–32)
Calcium: 9.8 mg/dL (ref 8.9–10.3)
Chloride: 108 mmol/L (ref 98–111)
Creatinine, Ser: 1.44 mg/dL — ABNORMAL HIGH (ref 0.61–1.24)
GFR, Estimated: 51 mL/min — ABNORMAL LOW (ref >60)
Glucose, Bld: 120 mg/dL — ABNORMAL HIGH (ref 70–99)
Potassium: 3.7 mmol/L (ref 3.5–5.1)
Sodium: 139 mmol/L (ref 135–145)

## 2020-12-25 LAB — CBC
HCT: 36.6 % — ABNORMAL LOW (ref 39.0–52.0)
Hemoglobin: 12.3 g/dL — ABNORMAL LOW (ref 13.0–17.0)
MCH: 29.9 pg (ref 26.0–34.0)
MCHC: 33.6 g/dL (ref 30.0–36.0)
MCV: 89.1 fL (ref 80.0–100.0)
Platelets: 247 10*3/uL (ref 150–400)
RBC: 4.11 MIL/uL — ABNORMAL LOW (ref 4.22–5.81)
RDW: 13.2 % (ref 11.5–15.5)
WBC: 5.8 10*3/uL (ref 4.0–10.5)
nRBC: 0 % (ref 0.0–0.2)

## 2020-12-25 LAB — SURGICAL PCR SCREEN
MRSA, PCR: NEGATIVE
Staphylococcus aureus: POSITIVE — AB

## 2020-12-25 NOTE — Progress Notes (Signed)
COVID Vaccine Completed:Yes  Date COVID Vaccine completed:12/06/19-Booster 07/29/20 COVID vaccine manufacturer: Pfizer     PCP - Dr. Shirley Friar Cardiologist - Dr. 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 Jeralene Huff Aspirin Instructions:Stop Xarelto 2 days prior to DOS and ASA 5 days prior to DOS/ Dr. Theda Sers Last Dose:12/30/20, 12/26/20  Anesthesia review:   Patient denies shortness of breath, fever, cough and chest pain at PAT appointment yes  Patient verbalized understanding of instructions that were given to them at the PAT appointment. Patient was also instructed that they will need to review over the PAT instructions again at home before surgery.yes Pt reports no SOB with any activities

## 2020-12-25 NOTE — Patient Instructions (Signed)
DUE TO COVID-19 ONLY ONE VISITOR IS ALLOWED TO COME WITH YOU AND STAY IN THE WAITING ROOM ONLY DURING PRE OP AND PROCEDURE DAY OF SURGERY. THE 1 VISITOR  MAY VISIT WITH YOU AFTER SURGERY IN YOUR PRIVATE ROOM DURING VISITING HOURS ONLY!  YOU NEED TO HAVE A COVID 19 TEST ON__3/5_____ @_______ , THIS TEST MUST BE DONE BEFORE SURGERY,  COVID TESTING SITE [address] [place] [address], IT IS ON THE RIGHT GOING OUT [address] APPROXIMATELY  2 MINUTES PAST [employer] ON THE RIGHT. ONCE YOUR COVID TEST IS COMPLETED,  PLEASE BEGIN THE QUARANTINE INSTRUCTIONS AS OUTLINED IN YOUR HANDOUT.                Philbert Riser    Your procedure is scheduled on: 01/01/21   Report to [facility] Main  Entrance   Report to admitting at   8:50 AM     Call this number if you have problems the morning of surgery [phone]    Remember: Do not eat food or drink liquids :After Midnight.   BRUSH YOUR TEETH MORNING OF SURGERY AND RINSE YOUR MOUTH OUT, NO CHEWING GUM CANDY OR MINTS.     Take these medicines the morning of surgery with A SIP OF WATER: Lexapro, Amlodipine, Omeprozole                                 You may not have any metal on your body including               piercings  Do not wear jewelry,  lotions, powders or deodorant                   Men may shave face and neck.   Do not bring valuables to the hospital. [place].  Contacts, dentures or bridgework may not be worn into surgery.        Special Instructions: N/A              Please read over the following fact sheets you were given: _____________________________________________________________________             [facility] - Preparing for Surgery Before surgery, you can play an important role.  Because skin is not sterile, your skin needs to be as free of germs as possible.  You can reduce the number of germs on your skin by washing with CHG  (chlorahexidine gluconate) soap before surgery.  CHG is an antiseptic cleaner which kills germs and bonds with the skin to continue killing germs even after washing. Please DO NOT use if you have an allergy to CHG or antibacterial soaps.  If your skin becomes reddened/irritated stop using the CHG and inform your nurse when you arrive at Short Stay.   You may shave your face/neck. Please follow these instructions carefully:  1.  Shower with CHG Soap the night before surgery and the  morning of Surgery.  2.  If you choose to wash your hair, wash your hair first as usual with your  normal  shampoo.  3.  After you shampoo, rinse your hair and body thoroughly to remove the  shampoo.  4.  Use CHG as you would any other liquid soap.  You can apply chg directly  to the skin and wash                       Gently with a scrungie or clean washcloth.  5.  Apply the CHG Soap to your body ONLY FROM THE NECK DOWN.   Do not use on face/ open                           Wound or open sores. Avoid contact with eyes, ears mouth and genitals (private parts).                       Wash face,  Genitals (private parts) with your normal soap.             6.  Wash thoroughly, paying special attention to the area where your surgery  will be performed.  7.  Thoroughly rinse your body with warm water from the neck down.  8.  DO NOT shower/wash with your normal soap after using and rinsing off  the CHG Soap.             9.  Pat yourself dry with a clean towel.            10.  Wear clean pajamas.            11.  Place clean sheets on your bed the night of your first shower and do not  sleep with pets. Day of Surgery : Do not apply any lotions/deodorants the morning of surgery.  Please wear clean clothes to the hospital/surgery center.  FAILURE TO FOLLOW THESE INSTRUCTIONS MAY RESULT IN THE CANCELLATION OF YOUR SURGERY PATIENT SIGNATURE_________________________________  NURSE  SIGNATURE__________________________________  ________________________________________________________________________   Adam Phenix  An incentive spirometer is a tool that can help keep your lungs clear and active. This tool measures how well you are filling your lungs with each breath. Taking long deep breaths may help reverse or decrease the chance of developing breathing (pulmonary) problems (especially infection) following:  A long period of time when you are unable to move or be active. BEFORE THE PROCEDURE   If the spirometer includes an indicator to show your best effort, your nurse or respiratory therapist will set it to a desired goal.  If possible, sit up straight or lean slightly forward. Try not to slouch.  Hold the incentive spirometer in an upright position. INSTRUCTIONS FOR USE  1. Sit on the edge of your bed if possible, or sit up as far as you can in bed or on a chair. 2. Hold the incentive spirometer in an upright position. 3. Breathe out normally. 4. Place the mouthpiece in your mouth and seal your lips tightly around it. 5. Breathe in slowly and as deeply as possible, raising the piston or the ball toward the top of the column. 6. Hold your breath for 3-5 seconds or for as long as possible. Allow the piston or ball to fall to the bottom of the column. 7. Remove the mouthpiece from your mouth and breathe out normally. 8. Rest for a few seconds and repeat Steps 1 through 7 at least 10 times every 1-2 hours when you are awake. Take your time and take a few normal breaths between deep breaths. 9. The spirometer may include an indicator to show your best effort.  Use the indicator as a goal to work toward during each repetition. 10. After each set of 10 deep breaths, practice coughing to be sure your lungs are clear. If you have an incision (the cut made at the time of surgery), support your incision when coughing by placing a pillow or rolled up towels firmly  against it. Once you are able to get out of bed, walk around indoors and cough well. You may stop using the incentive spirometer when instructed by your caregiver.  RISKS AND COMPLICATIONS  Take your time so you do not get dizzy or light-headed.  If you are in pain, you may need to take or ask for pain medication before doing incentive spirometry. It is harder to take a deep breath if you are having pain. AFTER USE  Rest and breathe slowly and easily.  It can be helpful to keep track of a log of your progress. Your caregiver can provide you with a simple table to help with this. If you are using the spirometer at home, follow these instructions: [place] IF:   You are having difficultly using the spirometer.  You have trouble using the spirometer as often as instructed.  Your pain medication is not giving enough relief while using the spirometer.  You develop fever of 100.5 F (38.1 C) or higher. SEEK IMMEDIATE MEDICAL CARE IF:   You cough up bloody sputum that had not been present before.  You develop fever of 102 F (38.9 C) or greater.  You develop worsening pain at or near the incision site. MAKE SURE YOU:   Understand these instructions.  Will watch your condition.  Will get help right away if you are not doing well or get worse. Document Released: 02/22/2007 Document Revised: 01/04/2012 Document Reviewed: 04/25/2007 [facility] Patient Information 2014 [place], [place].   ________________________________________________________________________

## 2020-12-25 NOTE — H&P (View-Only) (Signed)
COVID Vaccine Completed:Yes  Date COVID Vaccine completed:12/06/19-Booster 07/29/20 COVID vaccine manufacturer: Pfizer     PCP - Dr. Shirley Friar Cardiologist - Dr. 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 Jeralene Huff Aspirin Instructions:Stop Xarelto 2 days prior to DOS and ASA 5 days prior to DOS/ Dr. Theda Sers Last Dose:12/30/20, 12/26/20  Anesthesia review:   Patient denies shortness of breath, fever, cough and chest pain at PAT appointment yes  Patient verbalized understanding of instructions that were given to them at the PAT appointment. Patient was also instructed that they will need to review over the PAT instructions again at home before surgery.yes Pt reports no SOB with any activities

## 2020-12-26 ENCOUNTER — Encounter (HOSPITAL_COMMUNITY): Admission: RE | Admit: 2020-12-26 | Payer: Medicare Other | Source: Ambulatory Visit

## 2020-12-27 NOTE — H&P (Signed)
TOTAL KNEE ADMISSION H&P  Patient is being admitted for left total knee arthroplasty.  Subjective:  Chief Complaint:left knee pain.  HPI: Douglas Gay, 75 y.o. male, has a history of pain and functional disability in the left knee due to arthritis and has failed non-surgical conservative treatments for greater than 12 weeks to includeNSAID's and/or analgesics, corticosteriod injections, viscosupplementation injections and supervised PT with diminished ADL's post treatment.  Onset of symptoms was gradual, starting 5 years ago with gradually worsening course since that time. The patient noted no past surgery on the left knee(s).  Patient currently rates pain in the left knee(s) at 5 out of 10 with activity. Patient has night pain, worsening of pain with activity and weight bearing, pain that interferes with activities of daily living, crepitus and joint swelling.  Patient has evidence of subchondral sclerosis, periarticular osteophytes and joint space narrowing by imaging studies. This patient has had No previous injury. There is no active infection.  Patient Active Problem List   Diagnosis Date Noted  . Prostate cancer ([place]) 03/13/2015   Past Medical History:  Diagnosis Date  . Anxiety   . Arthritis   . Chronic kidney disease   . Depression   . GERD (gastroesophageal reflux disease)   . 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 History of gout   . History of kidney stones   . Hyperlipidemia   . Hypertension   . Peripheral vascular disease ([place])    DVT x3  . Prostate cancer ([place])   . Tinnitus    bi lat    Past Surgical History:  Procedure Laterality Date  . ANGIOPLASTY Right 2020   artery of leg with stent for "black toe"  . LYMPHADENECTOMY Bilateral 03/13/2015   Procedure: LIMITED BILATERAL PELVIC LYMPH NODE DISSECTION;  Surgeon: Alexis Frock, MD;  Location: [facility];  Service: Urology;  Laterality: Bilateral;  . PROSTATE BIOPSY    . ROBOT ASSISTED LAPAROSCOPIC  RADICAL PROSTATECTOMY N/A 03/13/2015   Procedure: ROBOTIC ASSISTED LAPAROSCOPIC RADICAL PROSTATECTOMY;  Surgeon: Alexis Frock, MD;  Location: [facility];  Service: Urology;  Laterality: N/A;  . TONSILLECTOMY      No current facility-administered medications for this encounter.   Current Outpatient Medications  Medication Sig Dispense Refill Last Dose  . amLODipine (NORVASC) 2.5 MG tablet Take 2.5 mg by mouth daily.     Marland Kitchen ascorbic acid (VITAMIN C) 500 MG tablet Take 500 mg by mouth daily.     Marland Kitchen aspirin EC 81 MG tablet Take 81 mg by mouth daily. Swallow whole.     . calcium carbonate (OS-CAL) 1250 (500 Ca) MG chewable tablet Chew 1 tablet by mouth 2 (two) times daily.     . carboxymethylcellul-glycerin (REFRESH RELIEVA) 0.5-0.9 % ophthalmic solution Place 1 drop into both eyes in the morning and at bedtime.     . cholecalciferol (VITAMIN D3) 25 MCG (1000 UNIT) tablet Take 1,000 Units by mouth daily.     . diphenhydramine-acetaminophen (TYLENOL PM) 25-500 MG TABS tablet Take 1 tablet by mouth at bedtime as needed (sleep).     Marland Kitchen escitalopram (LEXAPRO) 10 MG tablet Take 10 mg by mouth daily.     . ferrous sulfate 325 (65 FE) MG tablet Take 325 mg by mouth daily with breakfast.     . folic acid (FOLVITE) 850 MCG tablet Take 400 mcg by mouth daily.     Marland Kitchen lisinopril (ZESTRIL) 20 MG tablet Take 20 mg by mouth  daily.     . omeprazole (PRILOSEC) 20 MG capsule Take 20 mg by mouth every morning.     . rivaroxaban (XARELTO) 20 MG TABS tablet Take 20 mg by mouth daily with supper.     Marland Kitchen rOPINIRole (REQUIP) 0.25 MG tablet Take 0.25 mg by mouth at bedtime.     . rosuvastatin (CRESTOR) 20 MG tablet Take 20 mg by mouth every morning.      No Known Allergies  Social History   Tobacco Use  . Smoking status: Former Smoker    Quit date: 02/28/2003    Years since quitting: 17.8  . Smokeless tobacco: Never Used  Substance Use Topics  . Alcohol use: Yes    Comment: 2 x week    No family history on file.    Review of Systems  All other systems reviewed and are negative.   Objective:  Physical Exam Constitutional:      Appearance: Normal appearance. He is normal weight.  HENT:     Head: Normocephalic and atraumatic.  Eyes:     Extraocular Movements: Extraocular movements intact.  Cardiovascular:     Rate and Rhythm: Normal rate and regular rhythm.     Pulses: Normal pulses.     Heart sounds: Normal heart sounds. No murmur heard. No friction rub. No gallop.   Pulmonary:     Effort: Pulmonary effort is normal. No respiratory distress.     Breath sounds: Normal breath sounds. No stridor. No wheezing, rhonchi or rales.  Chest:     Chest wall: No tenderness.  Musculoskeletal:        General: Swelling and tenderness (over medial and later joint line) present.     Cervical back: Normal range of motion and neck supple.  Skin:    General: Skin is warm and dry.     Capillary Refill: Capillary refill takes less than 2 seconds.  Neurological:     General: No focal deficit present.     Mental Status: He is alert and oriented to person, place, and time. Mental status is at baseline.  Psychiatric:        Mood and Affect: Mood normal.        Behavior: Behavior normal.        Thought Content: Thought content normal.        Judgment: Judgment normal.     Vital signs in last 24 hours:    Labs:   Estimated body mass index is 27.12 kg/m as calculated from the following:   Height as of 12/25/20: 5\' 10"  (1.778 m).   Weight as of 12/25/20: 85.7 kg.   Imaging Review Plain radiographs demonstrate severe degenerative joint disease of the left knee(s). The overall alignment ismild varus. The bone quality appears to be good for age and reported activity level.      Assessment/Plan:  End stage arthritis, left knee   The patient history, physical examination, clinical judgment of the provider and imaging studies are consistent with end stage degenerative joint disease of the left knee(s)  and total knee arthroplasty is deemed medically necessary. The treatment options including medical management, injection therapy arthroscopy and arthroplasty were discussed at length. The risks and benefits of total knee arthroplasty were presented and reviewed. The risks due to aseptic loosening, infection, stiffness, patella tracking problems, thromboembolic complications and other imponderables were discussed. The patient acknowledged the explanation, agreed to proceed with the plan and consent was signed. Patient is being admitted for inpatient treatment for surgery, pain  control, PT, OT, prophylactic antibiotics, VTE prophylaxis, progressive ambulation and ADL's and discharge planning. The patient is planning to be discharged home with OPPT.      Patient's anticipated LOS is less than 2 midnights, meeting these requirements: - Younger than 13 - Lives within 1 hour of care - Has a competent adult at home to recover with post-op recover - NO history of  - Chronic pain requiring opiods  - Diabetes  - Coronary Artery Disease  - Heart failure  - Heart attack  - Stroke  - DVT/VTE  - Cardiac arrhythmia  - Respiratory Failure/COPD  - Renal failure  - Anemia  - Advanced Liver disease

## 2020-12-28 ENCOUNTER — Other Ambulatory Visit (HOSPITAL_COMMUNITY)
Admission: RE | Admit: 2020-12-28 | Discharge: 2020-12-28 | Disposition: A | Discharge status: Home / Self Care | Payer: Medicare Other | Source: Ambulatory Visit | Attending: Specialist | Admitting: Specialist

## 2020-12-28 DIAGNOSIS — Z20822 Contact with and (suspected) exposure to covid-19: Secondary | ICD-10-CM | POA: Diagnosis not present

## 2020-12-28 DIAGNOSIS — Z01812 Encounter for preprocedural laboratory examination: Secondary | ICD-10-CM | POA: Insufficient documentation

## 2020-12-28 LAB — SARS CORONAVIRUS 2 (TAT 6-24 HRS): SARS Coronavirus 2: NEGATIVE

## 2020-12-31 MED ORDER — BUPIVACAINE LIPOSOME 1.3 % IJ SUSP
20.0000 mL | Freq: Once | INTRAMUSCULAR | Status: DC
  Filled 2020-12-31: qty 20

## 2021-01-01 ENCOUNTER — Ambulatory Visit (HOSPITAL_COMMUNITY): Payer: Medicare Other | Admitting: Registered Nurse

## 2021-01-01 ENCOUNTER — Other Ambulatory Visit: Payer: Self-pay

## 2021-01-01 ENCOUNTER — Encounter (HOSPITAL_COMMUNITY): Payer: Self-pay | Admitting: Specialist

## 2021-01-01 ENCOUNTER — Ambulatory Visit (HOSPITAL_COMMUNITY): Payer: Medicare Other | Admitting: Physician Assistant

## 2021-01-01 ENCOUNTER — Observation Stay (HOSPITAL_COMMUNITY)
Admission: RE | Admit: 2021-01-01 | Discharge: 2021-01-02 | Disposition: A | Discharge status: Home / Self Care | Payer: Medicare Other | Attending: Specialist | Admitting: Specialist

## 2021-01-01 ENCOUNTER — Encounter (HOSPITAL_COMMUNITY)
Admission: RE | Disposition: A | Discharge status: Home / Self Care | Payer: Self-pay | Source: Home / Self Care | Attending: Specialist

## 2021-01-01 DIAGNOSIS — I129 Hypertensive chronic kidney disease with stage 1 through stage 4 chronic kidney disease, or unspecified chronic kidney disease: Secondary | ICD-10-CM | POA: Insufficient documentation

## 2021-01-01 DIAGNOSIS — Z87891 Personal history of nicotine dependence: Secondary | ICD-10-CM | POA: Insufficient documentation

## 2021-01-01 DIAGNOSIS — Z7901 Long term (current) use of anticoagulants: Secondary | ICD-10-CM | POA: Insufficient documentation

## 2021-01-01 DIAGNOSIS — Z7982 Long term (current) use of aspirin: Secondary | ICD-10-CM | POA: Diagnosis not present

## 2021-01-01 DIAGNOSIS — N189 Chronic kidney disease, unspecified: Secondary | ICD-10-CM | POA: Insufficient documentation

## 2021-01-01 DIAGNOSIS — Z8546 Personal history of malignant neoplasm of prostate: Secondary | ICD-10-CM | POA: Diagnosis not present

## 2021-01-01 DIAGNOSIS — Z79899 Other long term (current) drug therapy: Secondary | ICD-10-CM | POA: Insufficient documentation

## 2021-01-01 DIAGNOSIS — M1712 Unilateral primary osteoarthritis, left knee: Secondary | ICD-10-CM | POA: Diagnosis present

## 2021-01-01 HISTORY — PX: TOTAL KNEE ARTHROPLASTY: SHX125

## 2021-01-01 LAB — COMPREHENSIVE METABOLIC PANEL
ALT: 17 U/L (ref 0–44)
AST: 26 U/L (ref 15–41)
Albumin: 4.2 g/dL (ref 3.5–5.0)
Alkaline Phosphatase: 60 U/L (ref 38–126)
Anion gap: 11 (ref 5–15)
BUN: 27 mg/dL — ABNORMAL HIGH (ref 8–23)
CO2: 19 mmol/L — ABNORMAL LOW (ref 22–32)
Calcium: 9.9 mg/dL (ref 8.9–10.3)
Chloride: 109 mmol/L (ref 98–111)
Creatinine, Ser: 1.35 mg/dL — ABNORMAL HIGH (ref 0.61–1.24)
GFR, Estimated: 55 mL/min — ABNORMAL LOW (ref >60)
Glucose, Bld: 92 mg/dL (ref 70–99)
Potassium: 4.5 mmol/L (ref 3.5–5.1)
Sodium: 139 mmol/L (ref 135–145)
Total Bilirubin: 0.6 mg/dL (ref 0.3–1.2)
Total Protein: 7.2 g/dL (ref 6.5–8.1)

## 2021-01-01 LAB — CBC
HCT: 39.2 % (ref 39.0–52.0)
Hemoglobin: 13 g/dL (ref 13.0–17.0)
MCH: 29.7 pg (ref 26.0–34.0)
MCHC: 33.2 g/dL (ref 30.0–36.0)
MCV: 89.7 fL (ref 80.0–100.0)
Platelets: 227 10*3/uL (ref 150–400)
RBC: 4.37 MIL/uL (ref 4.22–5.81)
RDW: 13 % (ref 11.5–15.5)
WBC: 4.8 10*3/uL (ref 4.0–10.5)
nRBC: 0 % (ref 0.0–0.2)

## 2021-01-01 LAB — TYPE AND SCREEN
ABO/RH(D): A POS
Antibody Screen: NEGATIVE

## 2021-01-01 LAB — PROTIME-INR
INR: 1.2 (ref 0.8–1.2)
Prothrombin Time: 15 seconds (ref 11.4–15.2)

## 2021-01-01 LAB — APTT: aPTT: 63 seconds — ABNORMAL HIGH (ref 24–36)

## 2021-01-01 SURGERY — ARTHROPLASTY, KNEE, TOTAL
Anesthesia: General | Site: Knee | Laterality: Left

## 2021-01-01 MED ORDER — FENTANYL CITRATE (PF) 100 MCG/2ML IJ SOLN
INTRAMUSCULAR | Status: AC
  Filled 2021-01-01: qty 2

## 2021-01-01 MED ORDER — PHENYLEPHRINE 40 MCG/ML (10ML) SYRINGE FOR IV PUSH (FOR BLOOD PRESSURE SUPPORT)
PREFILLED_SYRINGE | INTRAVENOUS | Status: AC
  Filled 2021-01-01: qty 10

## 2021-01-01 MED ORDER — ROPIVACAINE HCL 7.5 MG/ML IJ SOLN
INTRAMUSCULAR | Status: DC | PRN
  Administered 2021-01-01: 20 mL via PERINEURAL

## 2021-01-01 MED ORDER — OXYCODONE HCL 5 MG PO TABS
5.0000 mg | ORAL_TABLET | ORAL | Status: DC | PRN
  Filled 2021-01-01: qty 2

## 2021-01-01 MED ORDER — TRAMADOL HCL 50 MG PO TABS
50.0000 mg | ORAL_TABLET | Freq: Four times a day (QID) | ORAL | Status: DC
Start: 2021-01-01 — End: 2021-01-02
  Administered 2021-01-01 – 2021-01-02 (×3): 50 mg via ORAL
  Filled 2021-01-01 (×3): qty 1

## 2021-01-01 MED ORDER — HYDROMORPHONE HCL 1 MG/ML IJ SOLN: 0.5000 mg | INTRAMUSCULAR | Status: DC | PRN

## 2021-01-01 MED ORDER — MIDAZOLAM HCL 2 MG/2ML IJ SOLN: 0.5000 mg | Freq: Once | INTRAMUSCULAR | Status: DC | PRN

## 2021-01-01 MED ORDER — BUPIVACAINE LIPOSOME 1.3 % IJ SUSP
INTRAMUSCULAR | Status: DC | PRN
  Administered 2021-01-01: 20 mL

## 2021-01-01 MED ORDER — HYDROMORPHONE HCL 1 MG/ML IJ SOLN
INTRAMUSCULAR | Status: AC
  Filled 2021-01-01: qty 1

## 2021-01-01 MED ORDER — SENNOSIDES-DOCUSATE SODIUM 8.6-50 MG PO TABS: 1.0000 | ORAL_TABLET | Freq: Every evening | ORAL | Status: DC | PRN

## 2021-01-01 MED ORDER — ONDANSETRON HCL 4 MG/2ML IJ SOLN: 4.0000 mg | Freq: Four times a day (QID) | INTRAMUSCULAR | Status: DC | PRN

## 2021-01-01 MED ORDER — DEXAMETHASONE SODIUM PHOSPHATE 10 MG/ML IJ SOLN
INTRAMUSCULAR | Status: AC
  Filled 2021-01-01: qty 1

## 2021-01-01 MED ORDER — SODIUM CHLORIDE 0.9 % IV SOLN: INTRAVENOUS | Status: DC

## 2021-01-01 MED ORDER — PROPOFOL 10 MG/ML IV BOLUS
INTRAVENOUS | Status: AC
  Filled 2021-01-01: qty 20

## 2021-01-01 MED ORDER — STERILE WATER FOR IRRIGATION IR SOLN
Status: DC | PRN
  Administered 2021-01-01: 2000 mL

## 2021-01-01 MED ORDER — ACETAMINOPHEN 500 MG PO TABS
1000.0000 mg | ORAL_TABLET | Freq: Four times a day (QID) | ORAL | Status: DC
  Administered 2021-01-01 – 2021-01-02 (×3): 1000 mg via ORAL
  Filled 2021-01-01 (×3): qty 2

## 2021-01-01 MED ORDER — MIDAZOLAM HCL 2 MG/2ML IJ SOLN
1.0000 mg | INTRAMUSCULAR | Status: DC
  Administered 2021-01-01: 1 mg via INTRAVENOUS
  Filled 2021-01-01: qty 2

## 2021-01-01 MED ORDER — CEFAZOLIN SODIUM-DEXTROSE 2-4 GM/100ML-% IV SOLN
2.0000 g | INTRAVENOUS | Status: AC
  Administered 2021-01-01: 2 g via INTRAVENOUS
  Filled 2021-01-01: qty 100

## 2021-01-01 MED ORDER — SODIUM CHLORIDE 0.9 % IR SOLN
Status: DC | PRN
  Administered 2021-01-01: 1000 mL

## 2021-01-01 MED ORDER — TRANEXAMIC ACID-NACL 1000-0.7 MG/100ML-% IV SOLN
1000.0000 mg | INTRAVENOUS | Status: AC
  Administered 2021-01-01: 1000 mg via INTRAVENOUS
  Filled 2021-01-01: qty 100

## 2021-01-01 MED ORDER — DIPHENHYDRAMINE HCL 12.5 MG/5ML PO ELIX
12.5000 mg | ORAL_SOLUTION | ORAL | Status: DC | PRN
Start: 2021-01-01 — End: 2021-01-02

## 2021-01-01 MED ORDER — AMLODIPINE BESYLATE 2.5 MG PO TABS
2.5000 mg | ORAL_TABLET | Freq: Every day | ORAL | Status: DC
  Administered 2021-01-02: 2.5 mg via ORAL
  Filled 2021-01-01: qty 1

## 2021-01-01 MED ORDER — 0.9 % SODIUM CHLORIDE (POUR BTL) OPTIME
TOPICAL | Status: DC | PRN
  Administered 2021-01-01: 1000 mL

## 2021-01-01 MED ORDER — FENTANYL CITRATE (PF) 100 MCG/2ML IJ SOLN
50.0000 ug | INTRAMUSCULAR | Status: DC
  Administered 2021-01-01 (×5): 50 ug via INTRAVENOUS
  Filled 2021-01-01: qty 2

## 2021-01-01 MED ORDER — PROMETHAZINE HCL 25 MG/ML IJ SOLN: 6.2500 mg | INTRAMUSCULAR | Status: DC | PRN

## 2021-01-01 MED ORDER — LACTATED RINGERS IV SOLN: INTRAVENOUS | Status: DC

## 2021-01-01 MED ORDER — ACETAMINOPHEN 500 MG PO TABS
1000.0000 mg | ORAL_TABLET | Freq: Once | ORAL | Status: AC
  Administered 2021-01-01: 1000 mg via ORAL
  Filled 2021-01-01: qty 2

## 2021-01-01 MED ORDER — PROPOFOL 1000 MG/100ML IV EMUL
INTRAVENOUS | Status: AC
  Filled 2021-01-01: qty 100

## 2021-01-01 MED ORDER — LIDOCAINE 2% (20 MG/ML) 5 ML SYRINGE
INTRAMUSCULAR | Status: DC | PRN
  Administered 2021-01-01: 80 mg via INTRAVENOUS

## 2021-01-01 MED ORDER — POVIDONE-IODINE 10 % EX SWAB
2.0000 "application " | Freq: Once | CUTANEOUS | Status: AC
  Administered 2021-01-01: 2 via TOPICAL

## 2021-01-01 MED ORDER — ONDANSETRON HCL 4 MG/2ML IJ SOLN
INTRAMUSCULAR | Status: AC
  Filled 2021-01-01: qty 2

## 2021-01-01 MED ORDER — ONDANSETRON HCL 4 MG PO TABS
4.0000 mg | ORAL_TABLET | Freq: Four times a day (QID) | ORAL | Status: DC | PRN
  Filled 2021-01-01: qty 1

## 2021-01-01 MED ORDER — ROPINIROLE HCL 0.25 MG PO TABS
0.2500 mg | ORAL_TABLET | Freq: Every day | ORAL | Status: DC
  Administered 2021-01-01: 0.25 mg via ORAL
  Filled 2021-01-01: qty 1

## 2021-01-01 MED ORDER — METHOCARBAMOL 500 MG IVPB - SIMPLE MED
500.0000 mg | Freq: Four times a day (QID) | INTRAVENOUS | Status: DC | PRN
  Filled 2021-01-01: qty 50

## 2021-01-01 MED ORDER — MEPERIDINE HCL 50 MG/ML IJ SOLN: 6.2500 mg | INTRAMUSCULAR | Status: DC | PRN

## 2021-01-01 MED ORDER — PANTOPRAZOLE SODIUM 40 MG PO TBEC
40.0000 mg | DELAYED_RELEASE_TABLET | Freq: Every day | ORAL | Status: DC
  Administered 2021-01-02: 40 mg via ORAL
  Filled 2021-01-01: qty 1

## 2021-01-01 MED ORDER — ROSUVASTATIN CALCIUM 20 MG PO TABS
20.0000 mg | ORAL_TABLET | Freq: Every morning | ORAL | Status: DC
  Administered 2021-01-02: 20 mg via ORAL
  Filled 2021-01-01: qty 1

## 2021-01-01 MED ORDER — PROPOFOL 10 MG/ML IV BOLUS
INTRAVENOUS | Status: DC | PRN
  Administered 2021-01-01: 80 mg via INTRAVENOUS
  Administered 2021-01-01: 200 mg via INTRAVENOUS

## 2021-01-01 MED ORDER — BISACODYL 5 MG PO TBEC
5.0000 mg | DELAYED_RELEASE_TABLET | Freq: Every day | ORAL | Status: DC | PRN
Start: 2021-01-01 — End: 2021-01-02

## 2021-01-01 MED ORDER — OXYCODONE HCL 5 MG/5ML PO SOLN
5.0000 mg | Freq: Once | ORAL | Status: DC | PRN
Start: 2021-01-01 — End: 2021-01-01

## 2021-01-01 MED ORDER — EPHEDRINE SULFATE-NACL 50-0.9 MG/10ML-% IV SOSY
PREFILLED_SYRINGE | INTRAVENOUS | Status: DC | PRN
  Administered 2021-01-01 (×4): 5 mg via INTRAVENOUS

## 2021-01-01 MED ORDER — DEXAMETHASONE SODIUM PHOSPHATE 10 MG/ML IJ SOLN
8.0000 mg | Freq: Once | INTRAMUSCULAR | Status: AC
  Administered 2021-01-01: 8 mg via INTRAVENOUS

## 2021-01-01 MED ORDER — SODIUM CHLORIDE (PF) 0.9 % IJ SOLN
INTRAMUSCULAR | Status: DC | PRN
  Administered 2021-01-01: 60 mL

## 2021-01-01 MED ORDER — ORAL CARE MOUTH RINSE: 15.0000 mL | Freq: Once | OROMUCOSAL | Status: AC

## 2021-01-01 MED ORDER — CHLORHEXIDINE GLUCONATE 0.12 % MT SOLN
15.0000 mL | Freq: Once | OROMUCOSAL | Status: AC
  Administered 2021-01-01: 15 mL via OROMUCOSAL

## 2021-01-01 MED ORDER — OXYCODONE HCL 5 MG PO TABS
10.0000 mg | ORAL_TABLET | ORAL | Status: DC | PRN
  Administered 2021-01-02: 10 mg via ORAL

## 2021-01-01 MED ORDER — ESCITALOPRAM OXALATE 10 MG PO TABS
10.0000 mg | ORAL_TABLET | Freq: Every day | ORAL | Status: DC
  Administered 2021-01-02: 10 mg via ORAL
  Filled 2021-01-01: qty 1

## 2021-01-01 MED ORDER — CEFAZOLIN SODIUM-DEXTROSE 1-4 GM/50ML-% IV SOLN
1.0000 g | Freq: Four times a day (QID) | INTRAVENOUS | Status: AC
  Administered 2021-01-01 – 2021-01-02 (×3): 1 g via INTRAVENOUS
  Filled 2021-01-01 (×3): qty 50

## 2021-01-01 MED ORDER — HYDROMORPHONE HCL 1 MG/ML IJ SOLN
0.2500 mg | INTRAMUSCULAR | Status: DC | PRN
  Administered 2021-01-01 (×4): 0.5 mg via INTRAVENOUS

## 2021-01-01 MED ORDER — METHOCARBAMOL 500 MG PO TABS
500.0000 mg | ORAL_TABLET | Freq: Four times a day (QID) | ORAL | Status: DC | PRN
  Administered 2021-01-01: 500 mg via ORAL
  Filled 2021-01-01: qty 1

## 2021-01-01 MED ORDER — GABAPENTIN 300 MG PO CAPS
300.0000 mg | ORAL_CAPSULE | Freq: Three times a day (TID) | ORAL | Status: DC
  Administered 2021-01-01 – 2021-01-02 (×3): 300 mg via ORAL
  Filled 2021-01-01 (×3): qty 1

## 2021-01-01 MED ORDER — ACETAMINOPHEN 325 MG PO TABS: 325.0000 mg | ORAL_TABLET | Freq: Four times a day (QID) | ORAL | Status: DC | PRN

## 2021-01-01 MED ORDER — OXYCODONE HCL 5 MG PO TABS: 5.0000 mg | ORAL_TABLET | Freq: Once | ORAL | Status: DC | PRN

## 2021-01-01 MED ORDER — SODIUM CHLORIDE (PF) 0.9 % IJ SOLN
INTRAMUSCULAR | Status: AC
  Filled 2021-01-01: qty 10

## 2021-01-01 MED ORDER — ONDANSETRON HCL 4 MG/2ML IJ SOLN
INTRAMUSCULAR | Status: DC | PRN
  Administered 2021-01-01: 4 mg via INTRAVENOUS

## 2021-01-01 MED ORDER — PHENYLEPHRINE 40 MCG/ML (10ML) SYRINGE FOR IV PUSH (FOR BLOOD PRESSURE SUPPORT)
PREFILLED_SYRINGE | INTRAVENOUS | Status: DC | PRN
  Administered 2021-01-01: 80 ug via INTRAVENOUS

## 2021-01-01 MED ORDER — MAGNESIUM CITRATE PO SOLN: 1.0000 | Freq: Once | ORAL | Status: DC | PRN

## 2021-01-01 MED ORDER — LISINOPRIL 20 MG PO TABS
20.0000 mg | ORAL_TABLET | Freq: Every day | ORAL | Status: DC
  Administered 2021-01-02: 20 mg via ORAL
  Filled 2021-01-01: qty 1

## 2021-01-01 MED ORDER — RIVAROXABAN 20 MG PO TABS: 20.0000 mg | ORAL_TABLET | Freq: Every day | ORAL | Status: DC

## 2021-01-01 SURGICAL SUPPLY — 62 items
ATTUNE MED DOME PAT 38 KNEE (Knees) ×2 IMPLANT
ATTUNE PS FEM LT SZ 8 CEM KNEE (Femur) ×2 IMPLANT
ATTUNE PSRP INSR SZ8 7 KNEE (Insert) ×2 IMPLANT
BAG ZIPLOCK 12X15 (MISCELLANEOUS) ×2 IMPLANT
BASE TIBIAL ROT PLAT SZ 8 KNEE (Knees) ×1 IMPLANT
BLADE SAG 18X100X1.27 (BLADE) ×2 IMPLANT
BLADE SAW SGTL 11.0X1.19X90.0M (BLADE) ×2 IMPLANT
BNDG ELASTIC 4X5.8 VLCR STR LF (GAUZE/BANDAGES/DRESSINGS) ×2 IMPLANT
BNDG ELASTIC 6X5.8 VLCR STR LF (GAUZE/BANDAGES/DRESSINGS) ×2 IMPLANT
BOWL SMART MIX CTS (DISPOSABLE) ×2 IMPLANT
CEMENT HV SMART SET (Cement) ×2 IMPLANT
COVER SURGICAL LIGHT HANDLE (MISCELLANEOUS) ×2 IMPLANT
COVER WAND RF STERILE (DRAPES) ×2 IMPLANT
CUFF TOURN SGL QUICK 34 (TOURNIQUET CUFF) ×2
CUFF TRNQT CYL 34X4.125X (TOURNIQUET CUFF) ×1 IMPLANT
DECANTER SPIKE VIAL GLASS SM (MISCELLANEOUS) ×2 IMPLANT
DERMABOND ADVANCED (GAUZE/BANDAGES/DRESSINGS) ×1
DERMABOND ADVANCED .7 DNX12 (GAUZE/BANDAGES/DRESSINGS) ×1 IMPLANT
DRAPE U-SHAPE 47X51 STRL (DRAPES) ×2 IMPLANT
DRSG AQUACEL AG ADV 3.5X10 (GAUZE/BANDAGES/DRESSINGS) ×2 IMPLANT
DRSG TEGADERM 4X4.75 (GAUZE/BANDAGES/DRESSINGS) ×2 IMPLANT
DURAPREP 26ML APPLICATOR (WOUND CARE) ×4 IMPLANT
ELECT REM PT RETURN 15FT ADLT (MISCELLANEOUS) ×2 IMPLANT
EVACUATOR 1/8 PVC DRAIN (DRAIN) ×2 IMPLANT
GAUZE SPONGE 2X2 8PLY STRL LF (GAUZE/BANDAGES/DRESSINGS) ×1 IMPLANT
GLOVE SRG 8 PF TXTR STRL LF DI (GLOVE) ×1 IMPLANT
GLOVE SURG LTX SZ8 (GLOVE) ×2 IMPLANT
GLOVE SURG ORTHO LTX SZ9 (GLOVE) ×2 IMPLANT
GLOVE SURG POLYISO LF SZ7 (GLOVE) ×2 IMPLANT
GLOVE SURG UNDER POLY LF SZ7.5 (GLOVE) ×2 IMPLANT
GLOVE SURG UNDER POLY LF SZ8 (GLOVE) ×2
GOWN STRL REUS W/TWL XL LVL3 (GOWN DISPOSABLE) ×4 IMPLANT
HANDPIECE INTERPULSE COAX TIP (DISPOSABLE) ×2
JET LAVAGE IRRISEPT WOUND (IRRIGATION / IRRIGATOR) ×2
KIT TURNOVER KIT A (KITS) ×2 IMPLANT
LAVAGE JET IRRISEPT WOUND (IRRIGATION / IRRIGATOR) ×1 IMPLANT
NS IRRIG 1000ML POUR BTL (IV SOLUTION) ×2 IMPLANT
PACK TOTAL KNEE CUSTOM (KITS) ×2 IMPLANT
PENCIL SMOKE EVACUATOR (MISCELLANEOUS) IMPLANT
PIN DRILL FIX HALF THREAD (BIT) ×2 IMPLANT
PIN STEINMAN FIXATION KNEE (PIN) ×2 IMPLANT
PROTECTOR NERVE ULNAR (MISCELLANEOUS) ×2 IMPLANT
SET HNDPC FAN SPRY TIP SCT (DISPOSABLE) ×1 IMPLANT
SET PAD KNEE POSITIONER (MISCELLANEOUS) ×2 IMPLANT
SPONGE GAUZE 2X2 STER 10/PKG (GAUZE/BANDAGES/DRESSINGS) ×1
SPONGE LAP 18X18 RF (DISPOSABLE) IMPLANT
SPONGE SURGIFOAM ABS GEL 100 (HEMOSTASIS) ×2 IMPLANT
STOCKINETTE 6  STRL (DRAPES) ×2
STOCKINETTE 6 STRL (DRAPES) ×1 IMPLANT
SUT BONE WAX W31G (SUTURE) IMPLANT
SUT MNCRL AB 3-0 PS2 18 (SUTURE) ×2 IMPLANT
SUT VIC AB 1 CT1 27 (SUTURE) ×6
SUT VIC AB 1 CT1 27XBRD ANTBC (SUTURE) ×3 IMPLANT
SUT VIC AB 2-0 CT1 27 (SUTURE) ×4
SUT VIC AB 2-0 CT1 TAPERPNT 27 (SUTURE) ×2 IMPLANT
SUT VLOC 180 0 24IN GS25 (SUTURE) ×2 IMPLANT
SYR 50ML LL SCALE MARK (SYRINGE) ×2 IMPLANT
TAPE STRIPS DRAPE STRL (GAUZE/BANDAGES/DRESSINGS) ×2 IMPLANT
TIBIAL BASE ROT PLAT SZ 8 KNEE (Knees) ×2 IMPLANT
TRAY CATH INTERMITTENT SS 16FR (CATHETERS) ×2 IMPLANT
WATER STERILE IRR 1000ML POUR (IV SOLUTION) ×4 IMPLANT
WRAP KNEE MAXI GEL POST OP (GAUZE/BANDAGES/DRESSINGS) ×2 IMPLANT

## 2021-01-01 NOTE — Anesthesia Preprocedure Evaluation (Addendum)
Anesthesia Evaluation  Patient identified by MRN, date of birth, ID band Patient awake    Reviewed: Allergy & Precautions, NPO status , Patient's Chart, lab work & pertinent test results  History of Anesthesia Complications Negative for: history of anesthetic complications  Airway Mallampati: II  TM Distance: >3 FB Neck ROM: Full    Dental  (+) Dental Advisory Given, Teeth Intact   Pulmonary former smoker,  12/28/2020 SARS coronavirus NEG   breath sounds clear to auscultation       Cardiovascular hypertension, Pt. on medications + Peripheral Vascular Disease (R iliac stent) and + DVT   Rhythm:Regular Rate:Normal     Neuro/Psych Anxiety Depression negative neurological ROS     GI/Hepatic Neg liver ROS, GERD  Medicated and Controlled,  Endo/Other  negative endocrine ROS  Renal/GU Renal InsufficiencyRenal disease (creat 1.44)stones     Musculoskeletal  (+) Arthritis ,   Abdominal   Peds  Hematology  (+) Blood dyscrasia (Factor V Leiden), , Xarelto: last dose sunday night   Anesthesia Other Findings   Reproductive/Obstetrics                            Anesthesia Physical Anesthesia Plan  ASA: III  Anesthesia Plan: General   Post-op Pain Management: GA combined w/ Regional for post-op pain   Induction: Intravenous  PONV Risk Score and Plan: 2 and Ondansetron and Dexamethasone  Airway Management Planned: LMA  Additional Equipment: None  Intra-op Plan:   Post-operative Plan:   Informed Consent: I have reviewed the patients History and Physical, chart, labs and discussed the procedure including the risks, benefits and alternatives for the proposed anesthesia with the patient or authorized representative who has indicated his/her understanding and acceptance.     Dental advisory given  Plan Discussed with: CRNA and Surgeon  Anesthesia Plan Comments: (Plan routine monitors, GA  (Xarelto) with adductor canal block for post op analgesia)       Anesthesia Quick Evaluation

## 2021-01-01 NOTE — Anesthesia Procedure Notes (Signed)
Procedure Name: LMA Insertion Date/Time: 01/01/2021 11:44 AM Performed by: [place] Evony Rezek C, CRNA Pre-anesthesia Checklist: Patient identified, Emergency Drugs available, Suction available and Patient being monitored Patient Re-evaluated:Patient Re-evaluated prior to induction Oxygen Delivery Method: Circle system utilized Preoxygenation: Pre-oxygenation with 100% oxygen Induction Type: IV induction Ventilation: Mask ventilation without difficulty LMA: LMA inserted LMA Size: 4.0 Number of attempts: 1 Airway Equipment and Method: Bite block Placement Confirmation: positive ETCO2 Tube secured with: Tape Dental Injury: Teeth and Oropharynx as per pre-operative assessment

## 2021-01-01 NOTE — Interval H&P Note (Signed)
History and Physical Interval Note:  01/01/2021 10:35 AM  Douglas Gay  has presented today for surgery, with the diagnosis of Left knee osteoarthritis.  The various methods of treatment have been discussed with the patient and family. After consideration of risks, benefits and other options for treatment, the patient has consented to  Procedure(s): TOTAL KNEE ARTHROPLASTY (Left) as a surgical intervention.  The patient's history has been reviewed, patient examined, no change in status, stable for surgery.  I have reviewed the patient's chart and labs.  Questions were answered to the patient's satisfaction.     [name] ANDREW

## 2021-01-01 NOTE — Op Note (Signed)
DATE OF SURGERY:  01/01/2021  TIME: 1:15 PM  PATIENT NAME:  Douglas Gay    AGE: 75 y.o.   PRE-OPERATIVE DIAGNOSIS:  Left knee osteoarthritis  POST-OPERATIVE DIAGNOSIS:  Left knee osteoarthritis  PROCEDURE:  Procedure(s): TOTAL KNEE ARTHROPLASTY  SURGEON:  Cleona Doubleday ANDREW  ASSISTANT:  Leeanne Haus, PA-C, present and scrubbed throughout the case, critical for assistance with exposure, retraction, instrumentation, and closure.  OPERATIVE IMPLANTS: Depuy PFC Attune Rotating Platform.  Femur size 8, Tibia size 8, Patella size 38 3-peg oval button, with a 7 mm polyethylene insert.   PREOPERATIVE INDICATIONS:   Douglas Gay is a 75 y.o. year old male with end stage bone on bone arthritis of the knee who failed conservative treatment and elected for Total Knee Arthroplasty.   The risks, benefits, and alternatives were discussed at length including but not limited to the risks of infection, bleeding, nerve injury, stiffness, blood clots, the need for revision surgery, cardiopulmonary complications, among others, and they were willing to proceed.  OPERATIVE DESCRIPTION:  The patient was brought to the operative room and placed in a supine position.  Spinal anesthesia was administered.  IV antibiotics were given.  The lower extremity was prepped and draped in the usual sterile fashion.  Time out was performed.  The leg was elevated and exsanguinated and the tourniquet was inflated.  Anterior quadriceps tendon splitting approach was performed.  The patella was retracted and osteophytes were removed.  The anterior horn of the medial and lateral meniscus was removed and cruciate ligaments resected.   The distal femur was opened with the drill and the intramedullary distal femoral cutting jig was utilized, set at 5 degrees resecting 10 mm off the distal femur.  Care was taken to protect the collateral ligaments.  The distal femoral sizing jig was applied, taking care to avoid notching.   Then the 4-in-1 cutting jig was applied and the anterior and posterior femur was cut, along with the chamfer cuts.    Then the extramedullary tibial cutting jig was utilized making the appropriate cut using the anterior tibial crest as a reference building in appropriate posterior slope.  Care was taken during the cut to protect the medial and collateral ligaments.  The proximal tibia was removed along with the posterior horns of the menisci.   The posterior medial femoral osteophytes and posterior lateral femoral osteophytes were removed.    The flexion gap was then measured and was symmetric with the extension gap, measured at 7.  I completed the distal femoral preparation using the appropriate jig to prepare the box.  The patella was then measured, and cut with the saw.    The proximal tibia sized and prepared accordingly with the reamer and the punch, and then all components were trialed with the trial insert.  The knee was found to have excellent balance and full motion.    The above named components were then cemented into place and all excess cement was removed.  The trial polyethylene component was in place during cementation, and then was exchanged for the real polyethylene component.    The knee was easily taken through a range of motion and the patella tracked well and the knee irrigated copiously and the parapatellar and subcutaneous tissue closed with vicryl, and monocryl with steri strips for the skin.  The arthrotomy was closed at 90 of flexion. The wounds were dressed with sterile gauze and the tourniquet released and the patient was awakened and returned to the PACU in  stable and satisfactory condition.  There were no complications.  Total tourniquet time was 75 minutes.

## 2021-01-01 NOTE — Plan of Care (Signed)

## 2021-01-01 NOTE — Anesthesia Postprocedure Evaluation (Signed)
Anesthesia Post Note  Patient: Douglas Gay  Procedure(s) Performed: TOTAL KNEE ARTHROPLASTY (Left Knee)     Patient location during evaluation: PACU Anesthesia Type: General Level of consciousness: awake and alert, patient cooperative and oriented Pain management: pain level controlled Vital Signs Assessment: post-procedure vital signs reviewed and stable Respiratory status: spontaneous breathing, nonlabored ventilation and respiratory function stable Cardiovascular status: blood pressure returned to baseline and stable Postop Assessment: no apparent nausea or vomiting Anesthetic complications: no   No complications documented.  Last Vitals:  Vitals:   01/01/21 1500 01/01/21 1508  BP: (!) 159/88 (!) 161/87  Pulse: 93 96  Resp: 17 18  Temp:  36.6 C  SpO2: 99% 98%    Last Pain:  Vitals:   01/01/21 1508  TempSrc: Oral  PainSc:                  Ileene Allie,E. Emeterio Balke

## 2021-01-01 NOTE — Evaluation (Signed)
Physical Therapy Evaluation Patient Details Name: Douglas Gay MRN: [id number] DOB: [birth year] Today's Date: 01/01/2021   History of Present Illness  Patient is 75 y.o. male s/p Lt TKA on 01/01/21 with PMH significant for OA, prostate cancer, PVD, HTN, HLD, GERD, CKD, depression, anxiety.    Clinical Impression  Douglas Gay is a 75 y.o. male POD 0 s/p Lt TKA. Patient reports independence with mobility at baseline. Patient is now limited by functional impairments (see PT problem list below) and requires min guard/assist for transfers and gait with RW. Patient was able to ambulate ~100' feet with RW and min guard/assist. Patient instructed in exercise to facilitate ROM and circulation to manage edema and reduce DVT risk. Patient will benefit from continued skilled PT interventions to address impairments and progress towards PLOF. Acute PT will follow to progress mobility and stair training in preparation for safe discharge home.     Follow Up Recommendations Follow surgeon's recommendation for DC plan and follow-up therapies;Home health PT    Equipment Recommendations  3in1 (PT)    Recommendations for Other Services       Precautions / Restrictions Precautions Precautions: Fall Restrictions Weight Bearing Restrictions: No Other Position/Activity Restrictions: WBAT      Mobility  Bed Mobility Overal bed mobility: Needs Assistance Bed Mobility: Supine to Sit     Supine to sit: Min guard;HOB elevated     General bed mobility comments: no assist needed, pt using bed rail to pivot to EOB.    Transfers Overall transfer level: Needs assistance Equipment used: Rolling walker (2 wheeled) Transfers: Sit to/from Stand Sit to Stand: Min assist;From elevated surface         General transfer comment: cues for hand placement and technique with RW. light assist for power up, pt steady once standing.  Ambulation/Gait Ambulation/Gait assistance: Min assist;Min guard Gait Distance  (Feet): 100 Feet Assistive device: Rolling walker (2 wheeled) Gait Pattern/deviations: Step-to pattern;Decreased stride length;Decreased weight shift to left;Decreased stance time - left Gait velocity: decr   General Gait Details: cues for step to pattern and proximity to RW, no overt LOB noted and no buckling at Lt knee. Pt required min assist initially for walker positioning and progressed to min guard for safety.  Stairs            Wheelchair Mobility    Modified Rankin (Stroke Patients Only)       Balance Overall balance assessment: Needs assistance Sitting-balance support: Feet supported Sitting balance-Leahy Scale: Good     Standing balance support: During functional activity;Bilateral upper extremity supported Standing balance-Leahy Scale: Fair Standing balance comment: pt able to stand in bathroom and manage urinal with no external support                             Pertinent Vitals/Pain Pain Assessment: 0-10 Pain Score: 5  Pain Location: Lt knee Pain Descriptors / Indicators: Aching;Discomfort Pain Intervention(s): Limited activity within patient's tolerance;Monitored during session;Repositioned;Ice applied;Premedicated before session    [place] expects to be discharged to:: Private residence Living Arrangements: Spouse/significant other Available Help at Discharge: Family Type of Home: House Home Access: Stairs to enter Entrance Stairs-Rails: None Entrance Stairs-Number of Steps: 1 Home Layout: One level Home Equipment: Environmental consultant - 2 wheels;Cane - single point;Shower seat      Prior Function Level of Independence: Independent               Hand Dominance  Dominant Hand: Right    Extremity/Trunk Assessment   Upper Extremity Assessment Upper Extremity Assessment: Overall WFL for tasks assessed    Lower Extremity Assessment Lower Extremity Assessment: LLE deficits/detail LLE Deficits / Details: good quad  activation, no extensor lag with SLR LLE Sensation: WNL LLE Coordination: WNL    Cervical / Trunk Assessment Cervical / Trunk Assessment: Normal  Communication   Communication: No difficulties  Cognition Arousal/Alertness: Awake/alert Behavior During Therapy: WFL for tasks assessed/performed Overall Cognitive Status: Within Functional Limits for tasks assessed                                        General Comments      Exercises Total Joint Exercises Ankle Circles/Pumps: Seated;AROM;Both;20 reps Quad Sets: AROM;Left;Seated;5 reps Heel Slides: AROM;Left;Seated;5 reps   Assessment/Plan    PT Assessment Patient needs continued PT services  PT Problem List Decreased strength;Decreased range of motion;Decreased activity tolerance;Decreased balance;Decreased mobility;Decreased knowledge of use of DME;Decreased knowledge of precautions;Pain       PT Treatment Interventions DME instruction;Gait training;Stair training;Functional mobility training;Therapeutic activities;Therapeutic exercise;Balance training;Patient/family education    PT Goals (Current goals can be found in the Care Plan section)  Acute Rehab PT Goals Patient Stated Goal: get back to traveling PT Goal Formulation: With patient Time For Goal Achievement: 01/08/21 Potential to Achieve Goals: Good    Frequency 7X/week   Barriers to discharge        Co-evaluation               AM-PAC PT "6 Clicks" Mobility  Outcome Measure Help needed turning from your back to your side while in a flat bed without using bedrails?: None Help needed moving from lying on your back to sitting on the side of a flat bed without using bedrails?: A Little Help needed moving to and from a bed to a chair (including a wheelchair)?: A Little Help needed standing up from a chair using your arms (e.g., wheelchair or bedside chair)?: A Little Help needed to walk in hospital room?: A Little Help needed climbing 3-5  steps with a railing? : A Little 6 Click Score: 19    End of Session Equipment Utilized During Treatment: Gait belt Activity Tolerance: Patient tolerated treatment well Patient left: in chair;with call bell/phone within reach;with chair alarm set;with family/visitor present Nurse Communication: Mobility status PT Visit Diagnosis: Muscle weakness (generalized) (M62.81);Difficulty in walking, not elsewhere classified (R26.2)    Time: 7829-5621 PT Time Calculation (min) (ACUTE ONLY): 26 min   Charges:   PT Evaluation $PT Eval Low Complexity: 1 Low PT Treatments $Gait Training: 8-22 mins        Verner Mould, DPT Acute Rehabilitation Services Office [phone] Pager [phone]    Jacques Navy 01/01/2021, 6:38 PM

## 2021-01-01 NOTE — Anesthesia Procedure Notes (Signed)
Anesthesia Regional Block: Adductor canal block   Pre-Anesthetic Checklist: ,, timeout performed, Correct Patient, Correct Site, Correct Laterality, Correct Procedure, Correct Position, site marked, Risks and benefits discussed,  Surgical consent,  Pre-op evaluation,  At surgeon's request and post-op pain management  Laterality: Left and Lower  Prep: chloraprep       Needles:  Injection technique: Single-shot  Needle Type: Echogenic Needle     Needle Length: 9cm  Needle Gauge: 21     Additional Needles:   Procedures:,,,, ultrasound used (permanent image in chart),,,,  Narrative:  Start time: 01/01/2021 10:35 AM End time: 01/01/2021 10:41 AM Injection made incrementally with aspirations every 5 mL.  Performed by: Personally  Anesthesiologist: Annye Asa, MD  Additional Notes: Pt identified in Holding room.  Monitors applied. Working IV access confirmed. Sterile prep L thigh.  #21ga Echogenic Arrow block needle into adductor canal with US guidance.  20cc 0.75% Ropivacaine injected incrementally after negative test dose.  Patient asymptomatic, VSS, no heme aspirated, tolerated well.  Jenita Seashore, MD

## 2021-01-01 NOTE — Transfer of Care (Signed)
Immediate Anesthesia Transfer of Care Note  Patient: Douglas Gay  Procedure(s) Performed: TOTAL KNEE ARTHROPLASTY (Left Knee)  Patient Location: PACU  Anesthesia Type:General  Level of Consciousness: awake, alert , oriented and patient cooperative  Airway & Oxygen Therapy: Patient Spontanous Breathing and Patient connected to face mask oxygen  Post-op Assessment: Report given to RN, Post -op Vital signs reviewed and stable and Patient moving all extremities  Post vital signs: Reviewed and stable  Last Vitals:  Vitals Value Taken Time  BP 161/90 01/01/21 1345  Temp    Pulse 94 01/01/21 1348  Resp 14 01/01/21 1348  SpO2 100 % 01/01/21 1348  Vitals shown include unvalidated device data.  Last Pain:  Vitals:   01/01/21 0928  TempSrc: Oral  PainSc: 0-No pain      Patients Stated Pain Goal: 3 (85/02/77 4128)  Complications: No complications documented.

## 2021-01-02 DIAGNOSIS — M1712 Unilateral primary osteoarthritis, left knee: Secondary | ICD-10-CM | POA: Diagnosis not present

## 2021-01-02 MED ORDER — TRAMADOL HCL 50 MG PO TABS: 50.0000 mg | ORAL_TABLET | Freq: Four times a day (QID) | ORAL | 0 refills | Status: AC

## 2021-01-02 MED ORDER — BISACODYL 5 MG PO TBEC: 5.0000 mg | DELAYED_RELEASE_TABLET | Freq: Every day | ORAL | 0 refills | Status: DC | PRN

## 2021-01-02 MED ORDER — ONDANSETRON HCL 4 MG PO TABS: 4.0000 mg | ORAL_TABLET | Freq: Four times a day (QID) | ORAL | 0 refills | Status: DC | PRN

## 2021-01-02 MED ORDER — OXYCODONE HCL 5 MG PO TABS: 5.0000 mg | ORAL_TABLET | ORAL | 0 refills | Status: AC | PRN

## 2021-01-02 MED ORDER — METHOCARBAMOL 500 MG PO TABS: 500.0000 mg | ORAL_TABLET | Freq: Four times a day (QID) | ORAL | 0 refills | Status: DC | PRN

## 2021-01-02 MED ORDER — GABAPENTIN 300 MG PO CAPS: 300.0000 mg | ORAL_CAPSULE | Freq: Every day | ORAL | 0 refills | Status: DC

## 2021-01-02 NOTE — Progress Notes (Signed)
Subjective: 1 Day Post-Op Procedure(s) (LRB): TOTAL KNEE ARTHROPLASTY (Left) Patient reports pain as mild.  Pain well controlled by PO meds Was able to get up and ambulate with PT yesterday + for void/ flatus  Objective: Vital signs in last 24 hours: Temp:  [97.5 F (36.4 C)-99.1 F (37.3 C)] 98.2 F (36.8 C) (03/10 0502) Pulse Rate:  [57-110] 93 (03/10 0502) Resp:  [13-19] 17 (03/10 0502) BP: (130-167)/(72-94) 144/76 (03/10 0502) SpO2:  [95 %-100 %] 96 % (03/10 0502) Weight:  [85.7 kg] 85.7 kg (03/09 0928)  Intake/Output from previous day: 03/09 0701 - 03/10 0700 In: 2587.1 [P.O.:480; I.V.:1807.1; IV Piggyback:300] Out: 1050 [Urine:1000; Blood:50] Intake/Output this shift: Total I/O In: 1012.1 [P.O.:480; I.V.:482.1; IV Piggyback:50] Out: 650 [Urine:650]  Recent Labs    01/01/21 0955  HGB 13.0   Recent Labs    01/01/21 0955  WBC 4.8  RBC 4.37  HCT 39.2  PLT 227   Recent Labs    01/01/21 0955  NA 139  K 4.5  CL 109  CO2 19*  BUN 27*  CREATININE 1.35*  GLUCOSE 92  CALCIUM 9.9   Recent Labs    01/01/21 0955  INR 1.2    Neurologically intact Neurovascular intact Sensation intact distally Intact pulses distally Dorsiflexion/Plantar flexion intact Incision: dressing C/D/I No cellulitis present Compartment soft   Assessment/Plan: 1 Day Post-Op Procedure(s) (LRB): TOTAL KNEE ARTHROPLASTY (Left) Advance diet Up with therapy, if cleared by PT today okay to be discharged home Discharge home today, has OPPT set up for tomorrow  Will be discharged home today  Meds sent to pharmacy  Already on [place] and will continue on it when home for DVT prophylaxis   Patient's anticipated LOS is less than 2 midnights, meeting these requirements: - Younger than 24 - Lives within 1 hour of care - Has a competent adult at home to recover with post-op recover - NO history of  - Chronic pain requiring opiods  - Diabetes  - Coronary Artery Disease  - Heart  failure  - Heart attack  - Stroke  - DVT/VTE  - Cardiac arrhythmia  - Respiratory Failure/COPD  - Renal failure  - Anemia  - Advanced Liver disease       Nettie Elm [facility] [phone] 01/02/2021, 6:24 AM

## 2021-01-02 NOTE — Discharge Summary (Signed)
Physician Discharge Summary  Patient ID: CORDAE Douglas Gay MRN: [id number] DOB/AGE: 05/20/1946 75 y.o.  Admit date: 01/01/2021 Discharge date: 01/02/2021  Admission Diagnoses: Left knee Osteoarthritis Discharge Diagnoses:  Active Problems:   Osteoarthritis of left knee   Discharged Condition: good  Hospital Course: Patient was admitted on March 9 for a left total knee arthroplasty due to end-stage left knee osteoarthritis.  Patient did well during surgery, was sent to the PACU in the postop floor in stable condition.  He was able to get up with physical therapy on the first day.  He was able to ambulate about 100 feet.  He had no difficulty.  No complications.  Postop day 1 no events overnight.  Positive for voiding flatus.  He was able to get up with therapy today.  He was cleared for discharge home.  He was sent home with pain medication, oxycodone, tramadol, gabapentin and Robaxin he was also sent home with Dulcolax as well as Zofran.  He is going to resume his Xarelto and his aspirin at home.  He will follow up in the office in 2 weeks.  He has his first physical therapy appointment tomorrow set up.  Consults: None  Significant Diagnostic Studies: none  Treatments: IV hydration, antibiotics: Ancef, analgesia: acetaminophen and tramadol, robaxin, oxycodone, dilaudid, anticoagulation: Xeralto, therapies: PT and surgery: left TKA  Discharge Exam: Blood pressure (!) 144/76, pulse 93, temperature 98.2 F (36.8 C), temperature source Oral, resp. rate 17, height 5\' 10"  (1.778 m), weight 85.7 kg, SpO2 96 %. General appearance: alert, cooperative, appears stated age and no distress Extremities: extremities normal, atraumatic, no cyanosis or edema and limited ROM of left knee due to left TKA Pulses: 2+ and symmetric Skin: Skin color, texture, turgor normal. No rashes or lesions Neurologic: Grossly normal Incision/Wound: dressings clean dry and intact  Disposition: Discharge disposition: 01-Home  or Self Care       Discharge Instructions    Call MD / Call 911   Complete by: As directed    If you experience chest pain or shortness of breath, CALL 911 and be transported to the hospital emergency room.  If you develope a fever above 101 F, pus (white drainage) or increased drainage or redness at the wound, or calf pain, call your surgeon's office.   Constipation Prevention   Complete by: As directed    Drink plenty of fluids.  Prune juice may be helpful.  You may use a stool softener, such as Colace (over the counter) 100 mg twice a day.  Use MiraLax (over the counter) for constipation as needed.   Diet - low sodium heart healthy   Complete by: As directed    Discharge instructions   Complete by: As directed    Dr. Sydnee Cabal [facility] [address] [place], [place] [address] [phone]  TOTAL KNEE REPLACEMENT POSTOPERATIVE DIRECTIONS  Knee Rehabilitation, Guidelines Following Surgery  Results after knee surgery are often greatly improved when you follow the exercise, range of motion and muscle strengthening exercises prescribed by your doctor. Safety measures are also important to protect the knee from further injury. Any time any of these exercises cause you to have increased pain or swelling in your knee joint, decrease the amount until you are comfortable again and slowly increase them. If you have problems or questions, call your caregiver or physical therapist for advice.   HOME CARE INSTRUCTIONS  Remove items at home which could result in a fall. This includes throw rugs  or furniture in walking pathways.  ICE to the affected knee every three hours for 30 minutes at a time and then as needed for pain and swelling.  Continue to use ice on the knee for pain and swelling from surgery. You may notice swelling that will progress down to the foot and ankle.  This is normal after surgery.  Elevate the leg when you are not up walking on it.   Continue to use  the breathing machine which will help keep your temperature down.  It is common for your temperature to cycle up and down following surgery, especially at night when you are not up moving around and exerting yourself.  The breathing machine keeps your lungs expanded and your temperature down. Do not place pillow under knee, focus on keeping the knee straight while resting   DIET You may resume your previous home diet once your are discharged from the hospital.  DRESSING / WOUND CARE / SHOWERING Keep the surgical dressing until follow up.  The dressing is water proof, but you need to put extra covering over it like plastic wrap.  IF THE DRESSING FALLS OFF or the wound gets wet inside, change the dressing with sterile gauze.  Please use good hand washing techniques before changing the dressing.  Do not use any lotions or creams on the incision until instructed by your surgeon.   You may start showering once you are discharged home but do not submerge the incision under water.  You are sent home with an ACE bandage on over the leg, this can be removed at 3 days from surgery. At this time you can start showering. Please place the white TED stocking on the surgical leg after. This needs to be worn on the surgical leg for 2 weeks after surgery.   ACTIVITY Walk with your walker as instructed. Use walker as long as suggested by your caregivers. Avoid periods of inactivity such as sitting longer than an hour when not asleep. This helps prevent blood clots.  You may resume a sexual relationship in one month or when given the OK by your doctor.  You may return to work once you are cleared by your doctor.  Do not drive a car for 6 weeks or until released by you surgeon.  Do not drive while taking narcotics.  WEIGHT BEARING Weight bearing as tolerated with assist device (walker, cane, etc) as directed, use it as long as suggested by your surgeon or therapist, typically at least 4-6 weeks.  POSTOPERATIVE  CONSTIPATION PROTOCOL Constipation - defined medically as fewer than three stools per week and severe constipation as less than one stool per week.  One of the most common issues patients have following surgery is constipation.  Even if you have a regular bowel pattern at home, your normal regimen is likely to be disrupted due to multiple reasons following surgery.  Combination of anesthesia, postoperative narcotics, change in appetite and fluid intake all can affect your bowels.  In order to avoid complications following surgery, here are some recommendations in order to help you during your recovery period.  Colace (docusate) - Pick up an over-the-counter form of Colace or another stool softener and take twice a day as long as you are requiring postoperative pain medications.  Take with a full glass of water daily.  If you experience loose stools or diarrhea, hold the colace until you stool forms back up.  If your symptoms do not get better within 1 week or if  they get worse, check with your doctor.  Dulcolax (bisacodyl) - Pick up over-the-counter and take as directed by the product packaging as needed to assist with the movement of your bowels.  Take with a full glass of water.  Use this product as needed if not relieved by Colace only.   MiraLax (polyethylene glycol) - Pick up over-the-counter to have on hand.  MiraLax is a solution that will increase the amount of water in your bowels to assist with bowel movements.  Take as directed and can mix with a glass of water, juice, soda, coffee, or tea.  Take if you go more than two days without a movement. Do not use MiraLax more than once per day. Call your doctor if you are still constipated or irregular after using this medication for 7 days in a row.  If you continue to have problems with postoperative constipation, please contact the office for further assistance and recommendations.  If you experience "the worst abdominal pain ever" or develop  nausea or vomiting, please contact the office immediatly for further recommendations for treatment.  ITCHING  If you experience itching with your medications, try taking only a single pain pill, or even half a pain pill at a time.  You can also use Benadryl over the counter for itching or also to help with sleep.   TED HOSE STOCKINGS Wear the elastic stockings on both legs for two weeks following surgery during the day but you may remove then at night for sleeping.  Okay to remove ACE in 3 days, put TED on after  MEDICATIONS See your medication summary on the "After Visit Summary" that the nursing staff will review with you prior to discharge.  You may have some home medications which will be placed on hold until you complete the course of blood thinner medication.  It is important for you to complete the blood thinner medication as prescribed by your surgeon.  Continue your approved medications as instructed at time of discharge. If you were not previously taking any blood thinners prior to surgery please start taking Aspirin 325 mg tabs twice daily for 6 weeks. If you are unable to take Aspirin please let your doctor know.   PRECAUTIONS If you experience chest pain or shortness of breath - call 911 immediately for transfer to the hospital emergency department.  If you develop a fever greater that 101 F, purulent drainage from wound, increased redness or drainage from wound, foul odor from the wound/dressing, or calf pain - CONTACT YOUR SURGEON.                                                   FOLLOW-UP APPOINTMENTS Make sure you keep all of your appointments after your operation with your surgeon and caregivers. You should call the office at the above phone number and make an appointment for approximately two weeks after the date of your surgery or on the date instructed by your surgeon outlined in the "After Visit Summary".   RANGE OF MOTION AND STRENGTHENING EXERCISES  Rehabilitation of  the knee is important following a knee injury or an operation. After just a few days of immobilization, the muscles of the thigh which control the knee become weakened and shrink (atrophy). Knee exercises are designed to build up the tone and strength of the thigh muscles and  to improve knee motion. Often times heat used for twenty to thirty minutes before working out will loosen up your tissues and help with improving the range of motion but do not use heat for the first two weeks following surgery. These exercises can be done on a training (exercise) mat, on the floor, on a table or on a bed. Use what ever works the best and is most comfortable for you Knee exercises include:  Leg Lifts - While your knee is still immobilized in a splint or cast, you can do straight leg raises. Lift the leg to 60 degrees, hold for 3 sec, and slowly lower the leg. Repeat 10-20 times 2-3 times daily. Perform this exercise against resistance later as your knee gets better.  Quad and Hamstring Sets - Tighten up the muscle on the front of the thigh (Quad) and hold for 5-10 sec. Repeat this 10-20 times hourly. Hamstring sets are done by pushing the foot backward against an object and holding for 5-10 sec. Repeat as with quad sets.  Leg Slides: Lying on your back, slowly slide your foot toward your buttocks, bending your knee up off the floor (only go as far as is comfortable). Then slowly slide your foot back down until your leg is flat on the floor again. Angel Wings: Lying on your back spread your legs to the side as far apart as you can without causing discomfort.  A rehabilitation program following serious knee injuries can speed recovery and prevent re-injury in the future due to weakened muscles. Contact your doctor or a physical therapist for more information on knee rehabilitation.   IF YOU ARE TRANSFERRED TO A SKILLED REHAB FACILITY If the patient is transferred to a skilled rehab facility following release from the  hospital, a list of the current medications will be sent to the facility for the patient to continue.  When discharged from the skilled rehab facility, please have the facility set up the patient's [facility] prior to being released. Also, the skilled facility will be responsible for providing the patient with their medications at time of release from the facility to include their pain medication, the muscle relaxants, and their blood thinner medication. If the patient is still at the rehab facility at time of the two week follow up appointment, the skilled rehab facility will also need to assist the patient in arranging follow up appointment in our office and any transportation needs.  MAKE SURE YOU:  Understand these instructions.  Get help right away if you are not doing well or get worse.    Pick up stool softner and laxative for home use following surgery while on pain medications. May shower starting three days after surgery. Please use a clean towel to pat the leg dry following showers. Continue to use ice for pain and swelling after surgery. Do not use any lotions or creams on the incision until instructed by you Start Aspirin immediately following surgery.   Do not put a pillow under the knee. Place it under the heel.   Complete by: As directed    Driving restrictions   Complete by: As directed    No driving for two weeks   TED hose   Complete by: As directed    Use stockings (TED hose) for three weeks on both leg(s).  You may remove them at night for sleeping.   Weight bearing as tolerated   Complete by: As directed      Allergies as of  01/02/2021   No Known Allergies     Medication List    TAKE these medications   amLODipine 2.5 MG tablet Commonly known as: NORVASC Take 2.5 mg by mouth daily.   ascorbic acid 500 MG tablet Commonly known as: VITAMIN C Take 500 mg by mouth daily.   aspirin EC 81 MG tablet Take 81 mg by mouth daily. Swallow whole.    bisacodyl 5 MG EC tablet Commonly known as: DULCOLAX Take 1 tablet (5 mg total) by mouth daily as needed for moderate constipation.   calcium carbonate 1250 (500 Ca) MG chewable tablet Commonly known as: OS-CAL Chew 1 tablet by mouth 2 (two) times daily.   cholecalciferol 25 MCG (1000 UNIT) tablet Commonly known as: VITAMIN D3 Take 1,000 Units by mouth daily.   diphenhydramine-acetaminophen 25-500 MG Tabs tablet Commonly known as: TYLENOL PM Take 1 tablet by mouth at bedtime as needed (sleep).   escitalopram 10 MG tablet Commonly known as: LEXAPRO Take 10 mg by mouth daily.   ferrous sulfate 325 (65 FE) MG tablet Take 325 mg by mouth daily with breakfast.   folic acid 621 MCG tablet Commonly known as: FOLVITE Take 400 mcg by mouth daily.   gabapentin 300 MG capsule Commonly known as: NEURONTIN Take 1 capsule (300 mg total) by mouth at bedtime for 14 days.   lisinopril 20 MG tablet Commonly known as: ZESTRIL Take 20 mg by mouth daily.   methocarbamol 500 MG tablet Commonly known as: ROBAXIN Take 1 tablet (500 mg total) by mouth every 6 (six) hours as needed for muscle spasms.   omeprazole 20 MG capsule Commonly known as: PRILOSEC Take 20 mg by mouth every morning.   ondansetron 4 MG tablet Commonly known as: ZOFRAN Take 1 tablet (4 mg total) by mouth every 6 (six) hours as needed for nausea.   oxyCODONE 5 MG immediate release tablet Commonly known as: Oxy IR/ROXICODONE Take 1 tablet (5 mg total) by mouth every 4 (four) hours as needed for up to 7 days for moderate pain (pain score 4-6).   Refresh Relieva 0.5-0.9 % ophthalmic solution Generic drug: carboxymethylcellul-glycerin Place 1 drop into both eyes in the morning and at bedtime.   rivaroxaban 20 MG Tabs tablet Commonly known as: XARELTO Take 20 mg by mouth daily with supper.   rOPINIRole 0.25 MG tablet Commonly known as: REQUIP Take 0.25 mg by mouth at bedtime.   rosuvastatin 20 MG tablet Commonly  known as: CRESTOR Take 20 mg by mouth every morning.   traMADol 50 MG tablet Commonly known as: ULTRAM Take 1 tablet (50 mg total) by mouth every 6 (six) hours for 7 days.            Discharge Care Instructions  (From admission, onward)         Start     Ordered   01/02/21 0000  Weight bearing as tolerated        01/02/21 3086           Signed: Drue Novel, PA-C [facility] - Dr. Theda Sers [phone] 01/02/2021, 6:32 AM

## 2021-01-02 NOTE — Plan of Care (Signed)
  Problem: Education: Goal: Knowledge of the prescribed therapeutic regimen will improve 01/02/2021 0008 by Josepha Pigg, RN Outcome: Progressing 01/01/2021 2353 by Josepha Pigg, RN Outcome: Progressing Goal: Individualized Educational Video(s) 01/02/2021 0008 by Josepha Pigg, RN Outcome: Progressing 01/01/2021 2353 by Josepha Pigg, RN Outcome: Progressing   Problem: Activity: Goal: Ability to avoid complications of mobility impairment will improve 01/02/2021 0008 by Josepha Pigg, RN Outcome: Progressing 01/01/2021 2353 by Josepha Pigg, RN Outcome: Progressing Goal: Range of joint motion will improve 01/02/2021 0008 by Josepha Pigg, RN Outcome: Progressing 01/01/2021 2353 by Josepha Pigg, RN Outcome: Progressing   Problem: Clinical Measurements: Goal: Postoperative complications will be avoided or minimized 01/02/2021 0008 by Josepha Pigg, RN Outcome: Progressing 01/01/2021 2353 by Josepha Pigg, RN Outcome: Progressing   Problem: Pain Management: Goal: Pain level will decrease with appropriate interventions 01/02/2021 0008 by Josepha Pigg, RN Outcome: Progressing 01/01/2021 2353 by Josepha Pigg, RN Outcome: Progressing   Problem: Skin Integrity: Goal: Will show signs of wound healing 01/02/2021 0008 by Josepha Pigg, RN Outcome: Progressing 01/01/2021 2353 by Josepha Pigg, RN Outcome: Progressing

## 2021-01-02 NOTE — Progress Notes (Signed)
Physical Therapy Treatment Patient Details Name: Douglas Gay MRN: [id number] DOB: 25-Feb-1946 Today's Date: 01/02/2021    History of Present Illness Patient is 75 y.o. male s/p Lt TKA on 01/01/21 with PMH significant for OA, prostate cancer, PVD, HTN, HLD, GERD, CKD, depression, anxiety.    PT Comments    Pt ambulated 120' with RW, no loss of balance. Stair training completed. Pt demonstrates good understanding of HEP. He is ready to DC home from PT standpoint.   Follow Up Recommendations  Follow surgeon's recommendation for DC plan and follow-up therapies     Equipment Recommendations  3in1 (PT)    Recommendations for Other Services       Precautions / Restrictions Precautions Precautions: Fall Restrictions Weight Bearing Restrictions: No LLE Weight Bearing: Weight bearing as tolerated Other Position/Activity Restrictions: WBAT    Mobility  Bed Mobility Overal bed mobility: Modified Independent Bed Mobility: Supine to Sit     Supine to sit: Modified independent (Device/Increase time);HOB elevated     General bed mobility comments: HOB up, used rail    Transfers Overall transfer level: Needs assistance Equipment used: Rolling walker (2 wheeled) Transfers: Sit to/from Stand Sit to Stand: Min guard;Supervision         General transfer comment: cues for hand placement and technique with RW  Ambulation/Gait Ambulation/Gait assistance: Supervision Gait Distance (Feet): 120 Feet Assistive device: Rolling walker (2 wheeled) Gait Pattern/deviations: Step-to pattern;Decreased stride length;Decreased weight shift to left;Decreased stance time - left Gait velocity: decr   General Gait Details: good sequencing, no buckling of L knee, no loss of balance   Stairs Stairs: Yes Stairs assistance: Min assist Stair Management: No rails;Forwards;With walker Number of Stairs: 1 General stair comments: VCs sequencing, min A to manage RW   Wheelchair Mobility     Modified Rankin (Stroke Patients Only)       Balance Overall balance assessment: Needs assistance Sitting-balance support: Feet supported Sitting balance-Leahy Scale: Good     Standing balance support: During functional activity;Bilateral upper extremity supported Standing balance-Leahy Scale: Fair Standing balance comment: pt able to stand in bathroom and manage urinal with no external support                            Cognition Arousal/Alertness: Awake/alert Behavior During Therapy: WFL for tasks assessed/performed Overall Cognitive Status: Within Functional Limits for tasks assessed                                        Exercises Total Joint Exercises Ankle Circles/Pumps: Seated;AROM;Both;15 reps Quad Sets: AROM;5 reps;Both Short Arc Quad: AROM;Left;10 reps;Supine Heel Slides: Left;AAROM;10 reps;Supine Hip ABduction/ADduction: AAROM;Left;10 reps;Supine Straight Leg Raises: AROM;Left;5 reps;Supine Long Arc Quad: AROM;Left;5 reps;Seated Knee Flexion: AAROM;Left;10 reps;Seated Goniometric ROM: 0-80* AAROM L knee    General Comments        Pertinent Vitals/Pain Pain Score: 5  Pain Location: Lt knee Pain Descriptors / Indicators: Aching;Discomfort Pain Intervention(s): Limited activity within patient's tolerance;Monitored during session;Patient requesting pain meds-RN notified;Ice applied    Home Living                      Prior Function            PT Goals (current goals can now be found in the care plan section) Acute Rehab PT Goals Patient Stated Goal: get back  to traveling PT Goal Formulation: With patient Time For Goal Achievement: 01/08/21 Potential to Achieve Goals: Good Progress towards PT goals: Progressing toward goals    Frequency    7X/week      PT Plan Current plan remains appropriate    Co-evaluation              AM-PAC PT "6 Clicks" Mobility   Outcome Measure  Help needed turning  from your back to your side while in a flat bed without using bedrails?: None Help needed moving from lying on your back to sitting on the side of a flat bed without using bedrails?: A Little Help needed moving to and from a bed to a chair (including a wheelchair)?: None Help needed standing up from a chair using your arms (e.g., wheelchair or bedside chair)?: None Help needed to walk in hospital room?: None Help needed climbing 3-5 steps with a railing? : A Little 6 Click Score: 22    End of Session Equipment Utilized During Treatment: Gait belt Activity Tolerance: Patient tolerated treatment well Patient left: in chair;with call bell/phone within reach;with chair alarm set;with family/visitor present Nurse Communication: Mobility status PT Visit Diagnosis: Muscle weakness (generalized) (M62.81);Difficulty in walking, not elsewhere classified (R26.2)     Time: 8850-2774 PT Time Calculation (min) (ACUTE ONLY): 26 min  Charges:  $Gait Training: 8-22 mins $Therapeutic Exercise: 8-22 mins                    [name] PT 01/02/2021  Acute Rehabilitation Services Pager [phone] Office [phone]

## 2021-01-02 NOTE — Progress Notes (Addendum)
Noted PA Haus at the bedside. Updated patient if cleared by PT(patient able to discharge home today).

## 2021-01-02 NOTE — TOC Transition Note (Signed)
Transition of Care [facility]) - CM/SW Discharge Note   Patient Details  Name: Douglas Gay MRN: [id number] Date of Birth: 1946-02-25  [facility] of [facility] CM/SW Contact:  Lia Hopping, [place] Phone Number: 01/02/2021, 9:26 AM   Clinical Narrative:    Prearranged therapy plan-OPPT Patient confirm he has a RW and will need a 3 in1. 3in1 ordered through [facility] and delivered to the patient bedside.   Final next level of care: OP Rehab Barriers to Discharge: No Barriers Identified   Patient Goals and CMS Choice        Discharge Placement                       Discharge Plan and Services                DME Arranged: 3-N-1 DME Agency: [employer] Date DME Agency Contacted: 01/02/21 Time DME Agency Contacted: [phone] Representative spoke with at DME Agency: [place] ([place]) Interventions     Readmission Risk Interventions No flowsheet data found.

## 2021-01-03 ENCOUNTER — Encounter (HOSPITAL_COMMUNITY): Payer: Self-pay | Admitting: Specialist

## 2021-12-02 NOTE — Progress Notes (Addendum)
COVID swab appointment: 12/15/21 @ 0945  COVID Vaccine Completed: yes x4 Date COVID Vaccine completed: 11/15/19, 12/06/19  Has received booster: 07/29/20, 08/07/21 COVID vaccine manufacturer: [place]      Date of COVID positive in last 90 days: no  PCP - Jefm Petty, MD Cardiologist - n/a  Clearance on chart by Jefm Petty 09/04/21  Chest x-ray - 05/05/21 Epic EKG - 12/04/21 Epic/chart Stress Test - many years per pt ECHO - n/a Cardiac Cath - n/a Pacemaker/ICD device last checked: n/a Spinal Cord Stimulator: n/a  Bowel Prep - n/a  Sleep Study - n/a CPAP -   Fasting Blood Sugar - n/a Checks Blood Sugar _____ times a day  Blood Thinner Instructions: Xarelto, hold 3 days Aspirin Instructions: ASA 81, 7 days Last Dose:  Activity level: Can go up a flight of stairs and perform activities of daily living without stopping and without symptoms of chest pain or shortness of breath.     Anesthesia review: factor v, PVD, HTN, DVT, CKD  Patient denies shortness of breath, fever, cough and chest pain at PAT appointment   Patient verbalized understanding of instructions that were given to them at the PAT appointment. Patient was also instructed that they will need to review over the PAT instructions again at home before surgery.

## 2021-12-02 NOTE — Patient Instructions (Addendum)
DUE TO COVID-19 ONLY ONE VISITOR IS ALLOWED TO COME WITH YOU AND STAY IN THE WAITING ROOM ONLY DURING PRE OP AND PROCEDURE.   **NO VISITORS ARE ALLOWED IN THE SHORT STAY AREA OR RECOVERY ROOM!!**  IF YOU WILL BE ADMITTED INTO THE HOSPITAL YOU ARE ALLOWED ONLY TWO SUPPORT PEOPLE DURING VISITATION HOURS ONLY (7 AM -8PM)   The support person(s) must pass our screening, gel in and out, and wear a mask at all times, including in the patients room. Patients must also wear a mask when staff or their support person are in the room. Visitors GUEST BADGE MUST BE WORN VISIBLY  One adult visitor may remain with you overnight and MUST be in the room by 8 P.M.  No visitors under the age of 3. Any visitor under the age of 44 must be accompanied by an adult.    COVID SWAB TESTING MUST BE COMPLETED ON:  12/15/21 @ 9:45 AM   Site: [facility] [place] Enter: Main Entrance have a seat in the waiting area to the right of main entrance (DO NOT [place]!!!!!) Dial: [phone] to alert staff you have arrived  You are not required to quarantine, however you are required to wear a well-fitted mask when you are out and around people not in your household.  Hand Hygiene often Do NOT share personal items Notify your provider if you are in close contact with someone who has COVID or you develop fever 100.4 or greater, new onset of sneezing, cough, sore throat, shortness of breath or body aches.       Your procedure is scheduled on: 12/17/21   Report to [facility] Main Entrance    Report to admitting at 7:35 AM   Call this number if you have problems the morning of surgery [phone]   Do not eat food :After Midnight.   May have liquids until 7:20 AM day of surgery  CLEAR LIQUID DIET  Foods Allowed                                                                     Foods Excluded  Water, Black Coffee and tea, regular and decaf                              liquids that you cannot  Plain Jell-O in any flavor  (No red)                                           see through such as: Fruit ices (not with fruit pulp)                                     milk, soups, orange juice              Iced Popsicles (No red)  All solid food                                   Apple juices Sports drinks like Gatorade (No red) Lightly seasoned clear broth or consume(fat free) Sugar    The day of surgery:  Drink ONE (1) Pre-Surgery Clear Ensure at 7:20 AM the morning of surgery. Drink in one sitting. Do not sip.  This drink was given to you during your hospital  pre-op appointment visit. Nothing else to drink after completing the  Pre-Surgery Clear Ensure.          If you have questions, please contact your surgeons office.  FOLLOW BOWEL PREP AND ANY ADDITIONAL PRE OP INSTRUCTIONS YOU RECEIVED FROM YOUR SURGEON'S OFFICE!!!     Oral Hygiene is also important to reduce your risk of infection.                                    Remember - BRUSH YOUR TEETH THE MORNING OF SURGERY WITH YOUR REGULAR TOOTHPASTE   Stop all vitamins and supplements 7 days before surgery   Follow instructions you were given regarding Plavix and Aspirin   Take these medicines the morning of surgery with A SIP OF WATER: Amlodipine, Lexapro, Omeprazole, Rosuvastatin                               You may not have any metal on your body including jewelry, and body piercing             Do not wear lotions, powders, cologne, or deodorant              Men may shave face and neck.   Do not bring valuables to the hospital. [place].   Bring small overnight bag day of surgery.   Special Instructions: Bring a copy of your healthcare power of attorney and living will documents         the day of surgery if you haven't scanned them before.              Please read over the following fact sheets you  were given: IF YOU HAVE QUESTIONS ABOUT YOUR PRE-OP INSTRUCTIONS PLEASE CALL [place] - Preparing for Surgery Before surgery, you can play an important role.  Because skin is not sterile, your skin needs to be as free of germs as possible.  You can reduce the number of germs on your skin by washing with CHG (chlorahexidine gluconate) soap before surgery.  CHG is an antiseptic cleaner which kills germs and bonds with the skin to continue killing germs even after washing. Please DO NOT use if you have an allergy to CHG or antibacterial soaps.  If your skin becomes reddened/irritated stop using the CHG and inform your nurse when you arrive at Short Stay. Do not shave (including legs and underarms) for at least 48 hours prior to the first CHG shower.  You may shave your face/neck.  Please follow these instructions carefully:  1.  Shower with CHG Soap the night before surgery and the  morning of surgery.  2.  If you choose to  wash your hair, wash your hair first as usual with your normal  shampoo.  3.  After you shampoo, rinse your hair and body thoroughly to remove the shampoo.                             4.  Use CHG as you would any other liquid soap.  You can apply chg directly to the skin and wash.  Gently with a scrungie or clean washcloth.  5.  Apply the CHG Soap to your body ONLY FROM THE NECK DOWN.   Do   not use on face/ open                           Wound or open sores. Avoid contact with eyes, ears mouth and   genitals (private parts).                       Wash face,  Genitals (private parts) with your normal soap.             6.  Wash thoroughly, paying special attention to the area where your    surgery  will be performed.  7.  Thoroughly rinse your body with warm water from the neck down.  8.  DO NOT shower/wash with your normal soap after using and rinsing off the CHG Soap.                9.  Pat yourself dry with a clean towel.            10.  Wear clean  pajamas.            11.  Place clean sheets on your bed the night of your first shower and do not  sleep with pets. Day of Surgery : Do not apply any lotions/deodorants the morning of surgery.  Please wear clean clothes to the hospital/surgery center.  FAILURE TO FOLLOW THESE INSTRUCTIONS MAY RESULT IN THE CANCELLATION OF YOUR SURGERY  PATIENT SIGNATURE_________________________________  NURSE SIGNATURE__________________________________  ________________________________________________________________________   Douglas Gay  An incentive spirometer is a tool that can help keep your lungs clear and active. This tool measures how well you are filling your lungs with each breath. Taking long deep breaths may help reverse or decrease the chance of developing breathing (pulmonary) problems (especially infection) following: A long period of time when you are unable to move or be active. BEFORE THE PROCEDURE  If the spirometer includes an indicator to show your best effort, your nurse or respiratory therapist will set it to a desired goal. If possible, sit up straight or lean slightly forward. Try not to slouch. Hold the incentive spirometer in an upright position. INSTRUCTIONS FOR USE  Sit on the edge of your bed if possible, or sit up as far as you can in bed or on a chair. Hold the incentive spirometer in an upright position. Breathe out normally. Place the mouthpiece in your mouth and seal your lips tightly around it. Breathe in slowly and as deeply as possible, raising the piston or the ball toward the top of the column. Hold your breath for 3-5 seconds or for as long as possible. Allow the piston or ball to fall to the bottom of the column. Remove the mouthpiece from your mouth and breathe out normally. Rest for a few seconds and repeat Steps 1 through 7 at  least 10 times every 1-2 hours when you are awake. Take your time and take a few normal breaths between deep breaths. The  spirometer may include an indicator to show your best effort. Use the indicator as a goal to work toward during each repetition. After each set of 10 deep breaths, practice coughing to be sure your lungs are clear. If you have an incision (the cut made at the time of surgery), support your incision when coughing by placing a pillow or rolled up towels firmly against it. Once you are able to get out of bed, walk around indoors and cough well. You may stop using the incentive spirometer when instructed by your caregiver.  RISKS AND COMPLICATIONS Take your time so you do not get dizzy or light-headed. If you are in pain, you may need to take or ask for pain medication before doing incentive spirometry. It is harder to take a deep breath if you are having pain. AFTER USE Rest and breathe slowly and easily. It can be helpful to keep track of a log of your progress. Your caregiver can provide you with a simple table to help with this. If you are using the spirometer at home, follow these instructions: [place] IF:  You are having difficultly using the spirometer. You have trouble using the spirometer as often as instructed. Your pain medication is not giving enough relief while using the spirometer. You develop fever of 100.5 F (38.1 C) or higher. SEEK IMMEDIATE MEDICAL CARE IF:  You cough up bloody sputum that had not been present before. You develop fever of 102 F (38.9 C) or greater. You develop worsening pain at or near the incision site. MAKE SURE YOU:  Understand these instructions. Will watch your condition. Will get help right away if you are not doing well or get worse. Document Released: 02/22/2007 Document Revised: 01/04/2012 Document Reviewed: 04/25/2007 ExitCare Patient Information 2014 Exit[employer], [place].   ________________________________________________________________________  WHAT IS A BLOOD TRANSFUSION? Blood Transfusion Information  A transfusion is the  replacement of blood or some of its parts. Blood is made up of multiple cells which provide different functions. Red blood cells carry oxygen and are used for blood loss replacement. White blood cells fight against infection. Platelets control bleeding. Plasma helps clot blood. Other blood products are available for specialized needs, such as hemophilia or other clotting disorders. BEFORE THE TRANSFUSION  Who gives blood for transfusions?  Healthy volunteers who are fully evaluated to make sure their blood is safe. This is blood bank blood. Transfusion therapy is the safest it has ever been in the practice of medicine. Before blood is taken from a donor, a complete history is taken to make sure that person has no history of diseases nor engages in risky social behavior (examples are intravenous drug use or sexual activity with multiple partners). The donor's travel history is screened to minimize risk of transmitting infections, such as malaria. The donated blood is tested for signs of infectious diseases, such as HIV and hepatitis. The blood is then tested to be sure it is compatible with you in order to minimize the chance of a transfusion reaction. If you or a relative donates blood, this is often done in anticipation of surgery and is not appropriate for emergency situations. It takes many days to process the donated blood. RISKS AND COMPLICATIONS Although transfusion therapy is very safe and saves many lives, the main dangers of transfusion include:  Getting an infectious disease. Developing a transfusion  reaction. This is an allergic reaction to something in the blood you were given. Every precaution is taken to prevent this. The decision to have a blood transfusion has been considered carefully by your caregiver before blood is given. Blood is not given unless the benefits outweigh the risks. AFTER THE TRANSFUSION Right after receiving a blood transfusion, you will usually feel much better and  more energetic. This is especially true if your red blood cells have gotten low (anemic). The transfusion raises the level of the red blood cells which carry oxygen, and this usually causes an energy increase. The nurse administering the transfusion will monitor you carefully for complications. HOME CARE INSTRUCTIONS  No special instructions are needed after a transfusion. You may find your energy is better. Speak with your caregiver about any limitations on activity for underlying diseases you may have. SEEK MEDICAL CARE IF:  Your condition is not improving after your transfusion. You develop redness or irritation at the intravenous (IV) site. SEEK IMMEDIATE MEDICAL CARE IF:  Any of the following symptoms occur over the next 12 hours: Shaking chills. You have a temperature by mouth above 102 F (38.9 C), not controlled by medicine. Chest, back, or muscle pain. People around you feel you are not acting correctly or are confused. Shortness of breath or difficulty breathing. Dizziness and fainting. You get a rash or develop hives. You have a decrease in urine output. Your urine turns a dark color or changes to pink, red, or brown. Any of the following symptoms occur over the next 10 days: You have a temperature by mouth above 102 F (38.9 C), not controlled by medicine. Shortness of breath. Weakness after normal activity. The white part of the eye turns yellow (jaundice). You have a decrease in the amount of urine or are urinating less often. Your urine turns a dark color or changes to pink, red, or brown. Document Released: 10/09/2000 Document Revised: 01/04/2012 Document Reviewed: 05/28/2008 [facility] Patient Information 2014 [place], [place].  _______________________________________________________________________

## 2021-12-04 ENCOUNTER — Encounter (HOSPITAL_COMMUNITY): Payer: Self-pay

## 2021-12-04 ENCOUNTER — Encounter (HOSPITAL_COMMUNITY)
Admission: RE | Admit: 2021-12-04 | Discharge: 2021-12-04 | Disposition: A | Discharge status: Home / Self Care | Payer: Medicare Other | Source: Ambulatory Visit | Attending: Specialist | Admitting: Specialist

## 2021-12-04 ENCOUNTER — Other Ambulatory Visit: Payer: Self-pay

## 2021-12-04 VITALS — BP 132/72 | HR 65 | Temp 98.8°F | Resp 16 | Ht 70.5 in | Wt 180.4 lb

## 2021-12-04 DIAGNOSIS — M1712 Unilateral primary osteoarthritis, left knee: Secondary | ICD-10-CM | POA: Insufficient documentation

## 2021-12-04 DIAGNOSIS — Z7901 Long term (current) use of anticoagulants: Secondary | ICD-10-CM | POA: Insufficient documentation

## 2021-12-04 DIAGNOSIS — Z01818 Encounter for other preprocedural examination: Secondary | ICD-10-CM | POA: Diagnosis not present

## 2021-12-04 LAB — COMPREHENSIVE METABOLIC PANEL
ALT: 17 U/L (ref 0–44)
AST: 23 U/L (ref 15–41)
Albumin: 4 g/dL (ref 3.5–5.0)
Alkaline Phosphatase: 63 U/L (ref 38–126)
Anion gap: 6 (ref 5–15)
BUN: 25 mg/dL — ABNORMAL HIGH (ref 8–23)
CO2: 24 mmol/L (ref 22–32)
Calcium: 9.8 mg/dL (ref 8.9–10.3)
Chloride: 108 mmol/L (ref 98–111)
Creatinine, Ser: 1.3 mg/dL — ABNORMAL HIGH (ref 0.61–1.24)
GFR, Estimated: 57 mL/min — ABNORMAL LOW (ref >60)
Glucose, Bld: 109 mg/dL — ABNORMAL HIGH (ref 70–99)
Potassium: 3.8 mmol/L (ref 3.5–5.1)
Sodium: 138 mmol/L (ref 135–145)
Total Bilirubin: 0.3 mg/dL (ref 0.3–1.2)
Total Protein: 6.4 g/dL — ABNORMAL LOW (ref 6.5–8.1)

## 2021-12-04 LAB — CBC
HCT: 36.9 % — ABNORMAL LOW (ref 39.0–52.0)
Hemoglobin: 12.1 g/dL — ABNORMAL LOW (ref 13.0–17.0)
MCH: 29.9 pg (ref 26.0–34.0)
MCHC: 32.8 g/dL (ref 30.0–36.0)
MCV: 91.1 fL (ref 80.0–100.0)
Platelets: 237 10*3/uL (ref 150–400)
RBC: 4.05 MIL/uL — ABNORMAL LOW (ref 4.22–5.81)
RDW: 13.2 % (ref 11.5–15.5)
WBC: 5.2 10*3/uL (ref 4.0–10.5)
nRBC: 0 % (ref 0.0–0.2)

## 2021-12-04 LAB — TYPE AND SCREEN
ABO/RH(D): A POS
Antibody Screen: NEGATIVE

## 2021-12-04 LAB — SURGICAL PCR SCREEN
MRSA, PCR: NEGATIVE
Staphylococcus aureus: POSITIVE — AB

## 2021-12-14 NOTE — H&P (Signed)
TOTAL KNEE ADMISSION H&P  Patient is being admitted for right total knee arthroplasty.  Subjective:  Chief Complaint:right knee pain.  HPI: Douglas Gay, 76 y.o. male, has a history of pain and functional disability in the right knee due to arthritis and has failed non-surgical conservative treatments for greater than 12 weeks to includeNSAID's and/or analgesics, corticosteriod injections, viscosupplementation injections, and activity modification.  Onset of symptoms was gradual, starting 5 years ago with gradually worsening course since that time. The patient noted no past surgery on the right knee(s).  Patient currently rates pain in the right knee(s) at 5 out of 10 with activity. Patient has night pain, worsening of pain with activity and weight bearing, pain that interferes with activities of daily living, pain with passive range of motion, and joint swelling.  Patient has evidence of subchondral sclerosis, periarticular osteophytes, and joint space narrowing by imaging studies. This patient has had  No previous injury . There is no active infection.  Patient Active Problem List   Diagnosis Date Noted   Osteoarthritis of left knee 01/01/2021   Prostate cancer ([place]) 03/13/2015   Past Medical History:  Diagnosis Date   Anxiety    Arthritis    Chronic kidney disease    Depression    GERD (gastroesophageal reflux disease)    History of DVT of lower extremity    Leiden factor V   History of gout    History of kidney stones    Hyperlipidemia    Hypertension    Peripheral vascular disease (HCC)    DVT x3   Prostate cancer ([place])    Tinnitus    bi lat    Past Surgical History:  Procedure Laterality Date   ANGIOPLASTY Right 2020   artery of leg with stent for "black toe"   LYMPHADENECTOMY Bilateral 03/13/2015   Procedure: LIMITED BILATERAL PELVIC LYMPH NODE DISSECTION;  Surgeon: Alexis Frock, MD;  Location: [facility];  Service: Urology;  Laterality: Bilateral;   PROSTATE BIOPSY      ROBOT ASSISTED LAPAROSCOPIC RADICAL PROSTATECTOMY N/A 03/13/2015   Procedure: ROBOTIC ASSISTED LAPAROSCOPIC RADICAL PROSTATECTOMY;  Surgeon: Alexis Frock, MD;  Location: [facility];  Service: Urology;  Laterality: N/A;   TONSILLECTOMY     TOTAL KNEE ARTHROPLASTY Left 01/01/2021   Procedure: TOTAL KNEE ARTHROPLASTY;  Surgeon: Sydnee Cabal, MD;  Location: [facility];  Service: Orthopedics;  Laterality: Left;    No current facility-administered medications for this encounter.   Current Outpatient Medications  Medication Sig Dispense Refill Last Dose   amLODipine (NORVASC) 2.5 MG tablet Take 2.5 mg by mouth daily.      ascorbic acid (VITAMIN C) 500 MG tablet Take 500 mg by mouth 2 (two) times daily.      aspirin EC 81 MG tablet Take 81 mg by mouth daily. Swallow whole.      calcium carbonate (OS-CAL) 1250 (500 Ca) MG chewable tablet Chew 1 tablet by mouth 2 (two) times daily.      carboxymethylcellul-glycerin (REFRESH RELIEVA) 0.5-0.9 % ophthalmic solution Place 1 drop into both eyes in the morning and at bedtime.      cholecalciferol (VITAMIN D3) 25 MCG (1000 UNIT) tablet Take 1,000 Units by mouth in the morning and at bedtime.      diphenhydramine-acetaminophen (TYLENOL PM) 25-500 MG TABS tablet Take 1 tablet by mouth at bedtime as needed (sleep).      escitalopram (LEXAPRO) 10 MG tablet Take 10 mg by mouth daily.      ferrous sulfate  325 (65 FE) MG tablet Take 325 mg by mouth 2 (two) times daily with a meal.      folic acid (FOLVITE) 338 MCG tablet Take 400 mcg by mouth daily.      lisinopril (ZESTRIL) 40 MG tablet Take 40 mg by mouth daily.      omeprazole (PRILOSEC) 20 MG capsule Take 20 mg by mouth every morning.      rivaroxaban (XARELTO) 20 MG TABS tablet Take 20 mg by mouth daily with supper.      rOPINIRole (REQUIP) 0.25 MG tablet Take 0.25 mg by mouth at bedtime.      rosuvastatin (CRESTOR) 20 MG tablet Take 20 mg by mouth every morning.      No Known Allergies  Social History   Tobacco  Use   Smoking status: Former    Types: Cigarettes    Quit date: 02/28/2003    Years since quitting: 18.8   Smokeless tobacco: Never  Substance Use Topics   Alcohol use: Yes    Alcohol/week: 4.0 standard drinks    Types: 4 Cans of beer per week    Comment: 2 x week    No family history on file.   Review of Systems  All other systems reviewed and are negative.  Objective:  Physical Exam Constitutional:      Appearance: Normal appearance.  HENT:     Head: Normocephalic and atraumatic.  Eyes:     Extraocular Movements: Extraocular movements intact.  Neck:     Vascular: No carotid bruit.  Cardiovascular:     Rate and Rhythm: Normal rate and regular rhythm.     Pulses: Normal pulses.     Heart sounds: Normal heart sounds. No murmur heard.   No friction rub. No gallop.  Pulmonary:     Effort: Pulmonary effort is normal.     Breath sounds: Normal breath sounds.  Musculoskeletal:     Cervical back: Normal range of motion and neck supple. No tenderness.     Comments: On exam of their right knee, no skin changes or effusions noted. Patient has tenderness with palpation over the medial and lateral joint spaces. Full extension, full flexion. Crepitus under the patella with flexion and extension.No laxity of varus valgus pressure. 5 out of 5 strength with resisted knee flexion and extension. Calf is supple. Neurovascularly intact in their right lower extremity.  Skin:    General: Skin is warm and dry.     Capillary Refill: Capillary refill takes less than 2 seconds.  Neurological:     General: No focal deficit present.     Mental Status: He is alert and oriented to person, place, and time.  Psychiatric:        Mood and Affect: Mood normal.        Behavior: Behavior normal.        Thought Content: Thought content normal.        Judgment: Judgment normal.    Vital signs in last 24 hours:    Labs:   Estimated body mass index is 25.52 kg/m as calculated from the following:    Height as of 12/04/21: 5' 10.5" (1.791 m).   Weight as of 12/04/21: 81.8 kg.   Imaging Review Plain radiographs demonstrate severe degenerative joint disease of the right knee(s). The overall alignment ismild varus. The bone quality appears to be good for age and reported activity level.      Assessment/Plan:  End stage arthritis, right knee   The patient history,  physical examination, clinical judgment of the provider and imaging studies are consistent with end stage degenerative joint disease of the right knee(s) and total knee arthroplasty is deemed medically necessary. The treatment options including medical management, injection therapy arthroscopy and arthroplasty were discussed at length. The risks and benefits of total knee arthroplasty were presented and reviewed. The risks due to aseptic loosening, infection, stiffness, patella tracking problems, thromboembolic complications and other imponderables were discussed. The patient acknowledged the explanation, agreed to proceed with the plan and consent was signed. Patient is being admitted for inpatient treatment for surgery, pain control, PT, OT, prophylactic antibiotics, VTE prophylaxis, progressive ambulation and ADL's and discharge planning. The patient is planning to be discharged  home with outpatient rehab at [facility] in [place] Patient is currently on Alen Blew that is managed by Cardiology. Per their recommendation he is okay to hold meds 3 days prior to surgery, no bridging needed. He will resume meds day after surgery.    Patient's anticipated LOS is less than 2 midnights, meeting these requirements: - Younger than 6 - Lives within 1 hour of care - Has a competent adult at home to recover with post-op recover - NO history of  - Chronic pain requiring opiods  - Diabetes  - Coronary Artery Disease  - Heart failure  - Heart attack  - Stroke  - DVT/VTE  - Cardiac arrhythmia  - Respiratory Failure/COPD  - Renal failure  -  Anemia  - Advanced Liver disease

## 2021-12-15 ENCOUNTER — Encounter (HOSPITAL_COMMUNITY)
Admission: RE | Admit: 2021-12-15 | Discharge: 2021-12-15 | Disposition: A | Discharge status: Home / Self Care | Payer: Medicare Other | Source: Ambulatory Visit | Attending: Specialist | Admitting: Specialist

## 2021-12-15 ENCOUNTER — Other Ambulatory Visit: Payer: Self-pay

## 2021-12-15 DIAGNOSIS — Z01812 Encounter for preprocedural laboratory examination: Secondary | ICD-10-CM

## 2021-12-15 DIAGNOSIS — Z20822 Contact with and (suspected) exposure to covid-19: Secondary | ICD-10-CM | POA: Diagnosis not present

## 2021-12-15 LAB — SARS CORONAVIRUS 2 (TAT 6-24 HRS): SARS Coronavirus 2: NEGATIVE

## 2021-12-17 ENCOUNTER — Encounter (HOSPITAL_COMMUNITY)
Admission: RE | Disposition: A | Discharge status: Home / Self Care | Payer: Self-pay | Source: Ambulatory Visit | Attending: Specialist

## 2021-12-17 ENCOUNTER — Ambulatory Visit (HOSPITAL_COMMUNITY): Payer: Medicare Other | Admitting: Physician Assistant

## 2021-12-17 ENCOUNTER — Encounter (HOSPITAL_COMMUNITY): Payer: Self-pay | Admitting: Specialist

## 2021-12-17 ENCOUNTER — Ambulatory Visit (HOSPITAL_BASED_OUTPATIENT_CLINIC_OR_DEPARTMENT_OTHER): Payer: Medicare Other | Admitting: Certified Registered"

## 2021-12-17 ENCOUNTER — Observation Stay (HOSPITAL_COMMUNITY)
Admission: RE | Admit: 2021-12-17 | Discharge: 2021-12-18 | Disposition: A | Discharge status: Home / Self Care | Payer: Medicare Other | Source: Ambulatory Visit | Attending: Specialist | Admitting: Specialist

## 2021-12-17 ENCOUNTER — Other Ambulatory Visit: Payer: Self-pay

## 2021-12-17 DIAGNOSIS — Z87891 Personal history of nicotine dependence: Secondary | ICD-10-CM | POA: Insufficient documentation

## 2021-12-17 DIAGNOSIS — M1712 Unilateral primary osteoarthritis, left knee: Secondary | ICD-10-CM

## 2021-12-17 DIAGNOSIS — Z79899 Other long term (current) drug therapy: Secondary | ICD-10-CM | POA: Diagnosis not present

## 2021-12-17 DIAGNOSIS — I129 Hypertensive chronic kidney disease with stage 1 through stage 4 chronic kidney disease, or unspecified chronic kidney disease: Secondary | ICD-10-CM | POA: Diagnosis not present

## 2021-12-17 DIAGNOSIS — F418 Other specified anxiety disorders: Secondary | ICD-10-CM | POA: Diagnosis not present

## 2021-12-17 DIAGNOSIS — I739 Peripheral vascular disease, unspecified: Secondary | ICD-10-CM

## 2021-12-17 DIAGNOSIS — Z7982 Long term (current) use of aspirin: Secondary | ICD-10-CM | POA: Diagnosis not present

## 2021-12-17 DIAGNOSIS — Z8546 Personal history of malignant neoplasm of prostate: Secondary | ICD-10-CM | POA: Diagnosis not present

## 2021-12-17 DIAGNOSIS — N189 Chronic kidney disease, unspecified: Secondary | ICD-10-CM | POA: Insufficient documentation

## 2021-12-17 DIAGNOSIS — M1711 Unilateral primary osteoarthritis, right knee: Principal | ICD-10-CM | POA: Insufficient documentation

## 2021-12-17 DIAGNOSIS — I10 Essential (primary) hypertension: Secondary | ICD-10-CM | POA: Diagnosis not present

## 2021-12-17 DIAGNOSIS — Z7901 Long term (current) use of anticoagulants: Secondary | ICD-10-CM

## 2021-12-17 DIAGNOSIS — Z96652 Presence of left artificial knee joint: Secondary | ICD-10-CM | POA: Insufficient documentation

## 2021-12-17 HISTORY — PX: TOTAL KNEE ARTHROPLASTY: SHX125

## 2021-12-17 LAB — PROTIME-INR
INR: 1.3 — ABNORMAL HIGH (ref 0.8–1.2)
Prothrombin Time: 16.4 seconds — ABNORMAL HIGH (ref 11.4–15.2)

## 2021-12-17 LAB — APTT: aPTT: 74 seconds — ABNORMAL HIGH (ref 24–36)

## 2021-12-17 SURGERY — ARTHROPLASTY, KNEE, TOTAL
Anesthesia: Monitor Anesthesia Care | Site: Knee | Laterality: Right

## 2021-12-17 MED ORDER — BUPIVACAINE IN DEXTROSE 0.75-8.25 % IT SOLN
INTRATHECAL | Status: DC | PRN
  Administered 2021-12-17: 1.8 mL via INTRATHECAL

## 2021-12-17 MED ORDER — BUPIVACAINE LIPOSOME 1.3 % IJ SUSP
INTRAMUSCULAR | Status: AC
  Filled 2021-12-17: qty 20

## 2021-12-17 MED ORDER — SENNOSIDES-DOCUSATE SODIUM 8.6-50 MG PO TABS: 1.0000 | ORAL_TABLET | Freq: Every evening | ORAL | Status: DC | PRN

## 2021-12-17 MED ORDER — DIPHENHYDRAMINE HCL 12.5 MG/5ML PO ELIX
12.5000 mg | ORAL_SOLUTION | ORAL | Status: DC | PRN
  Administered 2021-12-18: 25 mg via ORAL
  Filled 2021-12-17: qty 10

## 2021-12-17 MED ORDER — STERILE WATER FOR IRRIGATION IR SOLN
Status: DC | PRN
  Administered 2021-12-17: 2000 mL

## 2021-12-17 MED ORDER — CHLORHEXIDINE GLUCONATE 0.12 % MT SOLN
15.0000 mL | Freq: Once | OROMUCOSAL | Status: AC
  Administered 2021-12-17: 15 mL via OROMUCOSAL

## 2021-12-17 MED ORDER — RIVAROXABAN 20 MG PO TABS: 20.0000 mg | ORAL_TABLET | Freq: Every day | ORAL | Status: DC

## 2021-12-17 MED ORDER — METHOCARBAMOL 500 MG IVPB - SIMPLE MED
500.0000 mg | Freq: Four times a day (QID) | INTRAVENOUS | Status: DC | PRN
  Filled 2021-12-17: qty 50

## 2021-12-17 MED ORDER — LISINOPRIL 20 MG PO TABS
40.0000 mg | ORAL_TABLET | Freq: Every day | ORAL | Status: DC
  Administered 2021-12-17 – 2021-12-18 (×2): 40 mg via ORAL
  Filled 2021-12-17 (×2): qty 2

## 2021-12-17 MED ORDER — SODIUM CHLORIDE (PF) 0.9 % IJ SOLN
INTRAMUSCULAR | Status: AC
  Filled 2021-12-17: qty 10

## 2021-12-17 MED ORDER — ONDANSETRON HCL 4 MG/2ML IJ SOLN
INTRAMUSCULAR | Status: AC
  Filled 2021-12-17: qty 2

## 2021-12-17 MED ORDER — ROPINIROLE HCL 0.25 MG PO TABS
0.2500 mg | ORAL_TABLET | Freq: Every day | ORAL | Status: DC
  Administered 2021-12-17: 0.25 mg via ORAL
  Filled 2021-12-17: qty 1

## 2021-12-17 MED ORDER — ACETAMINOPHEN 10 MG/ML IV SOLN: 1000.0000 mg | Freq: Once | INTRAVENOUS | Status: DC | PRN

## 2021-12-17 MED ORDER — RIVAROXABAN 10 MG PO TABS
20.0000 mg | ORAL_TABLET | Freq: Every day | ORAL | Status: DC
  Filled 2021-12-17: qty 1

## 2021-12-17 MED ORDER — PROPOFOL 10 MG/ML IV BOLUS
INTRAVENOUS | Status: DC | PRN
  Administered 2021-12-17: 10 mg via INTRAVENOUS
  Administered 2021-12-17: 50 mg via INTRAVENOUS
  Administered 2021-12-17: 30 mg via INTRAVENOUS

## 2021-12-17 MED ORDER — LACTATED RINGERS IV SOLN: INTRAVENOUS | Status: DC

## 2021-12-17 MED ORDER — DEXAMETHASONE SODIUM PHOSPHATE 10 MG/ML IJ SOLN
8.0000 mg | Freq: Once | INTRAMUSCULAR | Status: AC
  Administered 2021-12-17: 8 mg via INTRAVENOUS

## 2021-12-17 MED ORDER — BISACODYL 5 MG PO TBEC: 5.0000 mg | DELAYED_RELEASE_TABLET | Freq: Every day | ORAL | Status: DC | PRN

## 2021-12-17 MED ORDER — SODIUM CHLORIDE 0.9 % IV SOLN: INTRAVENOUS | Status: DC

## 2021-12-17 MED ORDER — PANTOPRAZOLE SODIUM 40 MG PO TBEC
40.0000 mg | DELAYED_RELEASE_TABLET | Freq: Every day | ORAL | Status: DC
  Administered 2021-12-17 – 2021-12-18 (×2): 40 mg via ORAL
  Filled 2021-12-17 (×2): qty 1

## 2021-12-17 MED ORDER — ROPIVACAINE HCL 7.5 MG/ML IJ SOLN
INTRAMUSCULAR | Status: DC | PRN
  Administered 2021-12-17: 20 mL via PERINEURAL

## 2021-12-17 MED ORDER — TRAMADOL HCL 50 MG PO TABS
50.0000 mg | ORAL_TABLET | Freq: Four times a day (QID) | ORAL | Status: DC
  Administered 2021-12-17 – 2021-12-18 (×3): 50 mg via ORAL
  Filled 2021-12-17 (×3): qty 1

## 2021-12-17 MED ORDER — MIDAZOLAM HCL 2 MG/2ML IJ SOLN
INTRAMUSCULAR | Status: AC
  Filled 2021-12-17: qty 2

## 2021-12-17 MED ORDER — PROPOFOL 500 MG/50ML IV EMUL
INTRAVENOUS | Status: DC | PRN
  Administered 2021-12-17: 75 ug/kg/min via INTRAVENOUS

## 2021-12-17 MED ORDER — DEXAMETHASONE SODIUM PHOSPHATE 10 MG/ML IJ SOLN
INTRAMUSCULAR | Status: AC
  Filled 2021-12-17: qty 1

## 2021-12-17 MED ORDER — CEFAZOLIN SODIUM-DEXTROSE 2-4 GM/100ML-% IV SOLN
2.0000 g | INTRAVENOUS | Status: AC
  Administered 2021-12-17: 2 g via INTRAVENOUS
  Filled 2021-12-17: qty 100

## 2021-12-17 MED ORDER — POVIDONE-IODINE 10 % EX SWAB
2.0000 "application " | Freq: Once | CUTANEOUS | Status: AC
  Administered 2021-12-17: 2 via TOPICAL

## 2021-12-17 MED ORDER — FENTANYL CITRATE (PF) 100 MCG/2ML IJ SOLN
INTRAMUSCULAR | Status: AC
  Filled 2021-12-17: qty 2

## 2021-12-17 MED ORDER — MIDAZOLAM HCL 2 MG/2ML IJ SOLN
INTRAMUSCULAR | Status: DC | PRN
  Administered 2021-12-17: 1 mg via INTRAVENOUS
  Administered 2021-12-17 (×2): .5 mg via INTRAVENOUS

## 2021-12-17 MED ORDER — PHENYLEPHRINE 40 MCG/ML (10ML) SYRINGE FOR IV PUSH (FOR BLOOD PRESSURE SUPPORT): PREFILLED_SYRINGE | INTRAVENOUS | Status: DC | PRN

## 2021-12-17 MED ORDER — ACETAMINOPHEN 325 MG PO TABS: 325.0000 mg | ORAL_TABLET | Freq: Four times a day (QID) | ORAL | Status: DC | PRN

## 2021-12-17 MED ORDER — PROPOFOL 10 MG/ML IV BOLUS
INTRAVENOUS | Status: AC
  Filled 2021-12-17: qty 20

## 2021-12-17 MED ORDER — FENTANYL CITRATE (PF) 100 MCG/2ML IJ SOLN
INTRAMUSCULAR | Status: AC
Start: 2021-12-17 — End: ?
  Filled 2021-12-17: qty 2

## 2021-12-17 MED ORDER — ACETAMINOPHEN 500 MG PO TABS
1000.0000 mg | ORAL_TABLET | Freq: Four times a day (QID) | ORAL | Status: AC
  Administered 2021-12-17 – 2021-12-18 (×4): 1000 mg via ORAL
  Filled 2021-12-17 (×4): qty 2

## 2021-12-17 MED ORDER — PHENYLEPHRINE HCL-NACL 20-0.9 MG/250ML-% IV SOLN
INTRAVENOUS | Status: DC | PRN
  Administered 2021-12-17: 20 ug/min via INTRAVENOUS

## 2021-12-17 MED ORDER — SODIUM CHLORIDE 0.9 % IV SOLN
INTRAVENOUS | Status: DC | PRN
  Administered 2021-12-17: 80 mL

## 2021-12-17 MED ORDER — OXYCODONE HCL 5 MG PO TABS
5.0000 mg | ORAL_TABLET | ORAL | Status: DC | PRN
  Administered 2021-12-17: 10 mg via ORAL
  Filled 2021-12-17: qty 2

## 2021-12-17 MED ORDER — ORAL CARE MOUTH RINSE: 15.0000 mL | Freq: Once | OROMUCOSAL | Status: AC

## 2021-12-17 MED ORDER — TRANEXAMIC ACID-NACL 1000-0.7 MG/100ML-% IV SOLN
1000.0000 mg | INTRAVENOUS | Status: AC
  Administered 2021-12-17: 1000 mg via INTRAVENOUS
  Filled 2021-12-17: qty 100

## 2021-12-17 MED ORDER — AMLODIPINE BESYLATE 2.5 MG PO TABS
2.5000 mg | ORAL_TABLET | Freq: Every day | ORAL | Status: DC
  Administered 2021-12-17 – 2021-12-18 (×2): 2.5 mg via ORAL
  Filled 2021-12-17 (×3): qty 1

## 2021-12-17 MED ORDER — METHOCARBAMOL 500 MG PO TABS
500.0000 mg | ORAL_TABLET | Freq: Four times a day (QID) | ORAL | Status: DC | PRN
  Administered 2021-12-17 – 2021-12-18 (×2): 500 mg via ORAL
  Filled 2021-12-17 (×2): qty 1

## 2021-12-17 MED ORDER — FENTANYL CITRATE PF 50 MCG/ML IJ SOSY: 25.0000 ug | PREFILLED_SYRINGE | INTRAMUSCULAR | Status: DC | PRN

## 2021-12-17 MED ORDER — ROSUVASTATIN CALCIUM 20 MG PO TABS
20.0000 mg | ORAL_TABLET | Freq: Every morning | ORAL | Status: DC
  Administered 2021-12-17 – 2021-12-18 (×2): 20 mg via ORAL
  Filled 2021-12-17 (×2): qty 1

## 2021-12-17 MED ORDER — OXYCODONE HCL 5 MG PO TABS
10.0000 mg | ORAL_TABLET | ORAL | Status: DC | PRN
  Administered 2021-12-17 – 2021-12-18 (×2): 15 mg via ORAL
  Filled 2021-12-17 (×2): qty 3

## 2021-12-17 MED ORDER — FENTANYL CITRATE (PF) 100 MCG/2ML IJ SOLN
INTRAMUSCULAR | Status: DC | PRN
  Administered 2021-12-17 (×3): 25 ug via INTRAVENOUS
  Administered 2021-12-17 (×2): 50 ug via INTRAVENOUS
  Administered 2021-12-17: 25 ug via INTRAVENOUS
  Administered 2021-12-17: 50 ug via INTRAVENOUS

## 2021-12-17 MED ORDER — CEFAZOLIN SODIUM-DEXTROSE 1-4 GM/50ML-% IV SOLN
1.0000 g | Freq: Three times a day (TID) | INTRAVENOUS | Status: AC
  Administered 2021-12-17 – 2021-12-18 (×3): 1 g via INTRAVENOUS
  Filled 2021-12-17 (×3): qty 50

## 2021-12-17 MED ORDER — ONDANSETRON HCL 4 MG/2ML IJ SOLN
4.0000 mg | Freq: Four times a day (QID) | INTRAMUSCULAR | Status: DC | PRN
  Administered 2021-12-17: 4 mg via INTRAVENOUS

## 2021-12-17 MED ORDER — EPHEDRINE SULFATE-NACL 50-0.9 MG/10ML-% IV SOSY
PREFILLED_SYRINGE | INTRAVENOUS | Status: DC | PRN
  Administered 2021-12-17 (×5): 5 mg via INTRAVENOUS

## 2021-12-17 MED ORDER — ESCITALOPRAM OXALATE 10 MG PO TABS
10.0000 mg | ORAL_TABLET | Freq: Every day | ORAL | Status: DC
  Administered 2021-12-17 – 2021-12-18 (×2): 10 mg via ORAL
  Filled 2021-12-17 (×2): qty 1

## 2021-12-17 MED ORDER — BUPIVACAINE LIPOSOME 1.3 % IJ SUSP: 20.0000 mL | Freq: Once | INTRAMUSCULAR | Status: DC

## 2021-12-17 MED ORDER — HYDROMORPHONE HCL 1 MG/ML IJ SOLN
0.5000 mg | INTRAMUSCULAR | Status: DC | PRN
  Administered 2021-12-17 (×2): 0.5 mg via INTRAVENOUS
  Filled 2021-12-17 (×2): qty 1

## 2021-12-17 MED ORDER — 0.9 % SODIUM CHLORIDE (POUR BTL) OPTIME
TOPICAL | Status: DC | PRN
  Administered 2021-12-17: 1000 mL

## 2021-12-17 MED ORDER — ONDANSETRON HCL 4 MG PO TABS
4.0000 mg | ORAL_TABLET | Freq: Four times a day (QID) | ORAL | Status: DC | PRN
Start: 2021-12-17 — End: 2021-12-18

## 2021-12-17 MED ORDER — SODIUM CHLORIDE 0.9 % IR SOLN
Status: DC | PRN
  Administered 2021-12-17: 1000 mL

## 2021-12-17 SURGICAL SUPPLY — 56 items
ATTUNE MED DOME PAT 38 KNEE (Knees) ×1 IMPLANT
ATTUNE PS FEM RT SZ 7 CEM KNEE (Femur) ×1 IMPLANT
ATTUNE PSRP INSR SZ7 8 KNEE (Insert) ×1 IMPLANT
BAG COUNTER SPONGE SURGICOUNT (BAG) ×2 IMPLANT
BAG ZIPLOCK 12X15 (MISCELLANEOUS) ×2 IMPLANT
BASE TIBIAL ROT PLAT SZ 8 KNEE (Knees) IMPLANT
BLADE SAG 18X100X1.27 (BLADE) ×2 IMPLANT
BLADE SAW SGTL 11.0X1.19X90.0M (BLADE) ×2 IMPLANT
BNDG ELASTIC 4X5.8 VLCR STR LF (GAUZE/BANDAGES/DRESSINGS) ×2 IMPLANT
BNDG ELASTIC 6X5.8 VLCR STR LF (GAUZE/BANDAGES/DRESSINGS) ×2 IMPLANT
BOWL SMART MIX CTS (DISPOSABLE) ×2 IMPLANT
CEMENT HV SMART SET (Cement) ×2 IMPLANT
COVER SURGICAL LIGHT HANDLE (MISCELLANEOUS) ×2 IMPLANT
CUFF TOURN SGL QUICK 34 (TOURNIQUET CUFF) ×2
CUFF TRNQT CYL 34X4.125X (TOURNIQUET CUFF) ×1 IMPLANT
DERMABOND ADVANCED (GAUZE/BANDAGES/DRESSINGS) ×2
DERMABOND ADVANCED .7 DNX12 (GAUZE/BANDAGES/DRESSINGS) ×1 IMPLANT
DRAPE INCISE IOBAN 66X45 STRL (DRAPES) ×2 IMPLANT
DRAPE U-SHAPE 47X51 STRL (DRAPES) ×2 IMPLANT
DRESSING AQUACEL AG SP 3.5X10 (GAUZE/BANDAGES/DRESSINGS) IMPLANT
DRSG AQUACEL AG ADV 3.5X10 (GAUZE/BANDAGES/DRESSINGS) ×2 IMPLANT
DRSG AQUACEL AG SP 3.5X10 (GAUZE/BANDAGES/DRESSINGS) ×2
DURAPREP 26ML APPLICATOR (WOUND CARE) ×4 IMPLANT
ELECT REM PT RETURN 15FT ADLT (MISCELLANEOUS) ×2 IMPLANT
GLOVE SRG 8 PF TXTR STRL LF DI (GLOVE) ×1 IMPLANT
GLOVE SURG NEOPR MICRO LF SZ8 (GLOVE) ×2 IMPLANT
GLOVE SURG ORTHO LTX SZ9 (GLOVE) ×2 IMPLANT
GLOVE SURG POLYISO LF SZ7 (GLOVE) ×2 IMPLANT
GLOVE SURG UNDER POLY LF SZ7.5 (GLOVE) ×2 IMPLANT
GLOVE SURG UNDER POLY LF SZ8 (GLOVE) ×2
GOWN STRL REUS W/TWL XL LVL3 (GOWN DISPOSABLE) ×4 IMPLANT
HANDPIECE INTERPULSE COAX TIP (DISPOSABLE) ×2
KIT TURNOVER KIT A (KITS) IMPLANT
NS IRRIG 1000ML POUR BTL (IV SOLUTION) ×2 IMPLANT
PACK TOTAL KNEE CUSTOM (KITS) ×2 IMPLANT
PROTECTOR NERVE ULNAR (MISCELLANEOUS) ×2 IMPLANT
SET HNDPC FAN SPRY TIP SCT (DISPOSABLE) ×1 IMPLANT
SET PAD KNEE POSITIONER (MISCELLANEOUS) ×2 IMPLANT
SOLUTION IRRIG SURGIPHOR (IV SOLUTION) ×2 IMPLANT
SPIKE FLUID TRANSFER (MISCELLANEOUS) ×2 IMPLANT
SPONGE SURGIFOAM ABS GEL 100 (HEMOSTASIS) ×2 IMPLANT
SPONGE T-LAP 18X18 ~~LOC~~+RFID (SPONGE) IMPLANT
STOCKINETTE 6  STRL (DRAPES) ×2
STOCKINETTE 6 STRL (DRAPES) ×1 IMPLANT
SUT MNCRL AB 3-0 PS2 18 (SUTURE) ×2 IMPLANT
SUT VIC AB 1 CT1 27 (SUTURE) ×6
SUT VIC AB 1 CT1 27XBRD ANTBC (SUTURE) ×3 IMPLANT
SUT VIC AB 2-0 CT1 27 (SUTURE) ×4
SUT VIC AB 2-0 CT1 TAPERPNT 27 (SUTURE) ×2 IMPLANT
SUT VLOC 180 0 24IN GS25 (SUTURE) ×2 IMPLANT
SYR 50ML LL SCALE MARK (SYRINGE) ×2 IMPLANT
TAPE STRIPS DRAPE STRL (GAUZE/BANDAGES/DRESSINGS) ×2 IMPLANT
TIBIAL BASE ROT PLAT SZ 8 KNEE (Knees) ×2 IMPLANT
TRAY CATH INTERMITTENT SS 16FR (CATHETERS) ×2 IMPLANT
WATER STERILE IRR 1000ML POUR (IV SOLUTION) ×4 IMPLANT
WRAP KNEE MAXI GEL POST OP (GAUZE/BANDAGES/DRESSINGS) ×1 IMPLANT

## 2021-12-17 NOTE — Anesthesia Procedure Notes (Signed)
Procedure Name: LMA Insertion Date/Time: 12/17/2021 9:31 AM Performed by: Eben Burow, CRNA Pre-anesthesia Checklist: Patient identified, Emergency Drugs available, Suction available, Patient being monitored and Timeout performed Patient Re-evaluated:Patient Re-evaluated prior to induction Oxygen Delivery Method: Circle system utilized Preoxygenation: Pre-oxygenation with 100% oxygen Induction Type: IV induction LMA: LMA inserted LMA Size: 4.0 Number of attempts: 1 Tube secured with: Tape Dental Injury: Teeth and Oropharynx as per pre-operative assessment  Comments: LMA placed by Dr Gloris [place]

## 2021-12-17 NOTE — Anesthesia Procedure Notes (Signed)
Spinal  Patient location during procedure: OR Start time: 12/17/2021 8:54 AM Reason for block: surgical anesthesia Staffing Performed: resident/CRNA  Anesthesiologist: Darral Dash, DO Resident/CRNA: Eben Burow, CRNA Preanesthetic Checklist Completed: patient identified, IV checked, site marked, risks and benefits discussed, surgical consent, monitors and equipment checked, pre-op evaluation and timeout performed Spinal Block Patient position: sitting Prep: DuraPrep and site prepped and draped Patient monitoring: continuous pulse ox, blood pressure, heart rate and cardiac monitor Approach: midline Location: L3-4 Injection technique: single-shot Needle Needle type: Pencan  Needle gauge: 24 G Needle length: 10 cm Assessment Events: CSF return Additional Notes Pt placed in sitting position, spinal kit expiration date checked and verified, + CSF, - heme, pt tolerated well. Adequate sensory level. Dr Rex Kras present and supervising throughout SAB placement.

## 2021-12-17 NOTE — Transfer of Care (Signed)
Immediate Anesthesia Transfer of Care Note  Patient: Douglas Gay  Procedure(s) Performed: TOTAL KNEE ARTHROPLASTY (Right: Knee)  Patient Location: PACU  Anesthesia Type:General  Level of Consciousness: awake, drowsy and patient cooperative  Airway & Oxygen Therapy: Patient Spontanous Breathing and Patient connected to face mask oxygen  Post-op Assessment: Report given to RN and Post -op Vital signs reviewed and stable  Post vital signs: Reviewed and stable  Last Vitals:  Vitals Value Taken Time  BP 143/78 12/17/21 1124  Temp    Pulse 93 12/17/21 1126  Resp 14 12/17/21 1126  SpO2 100 % 12/17/21 1126  Vitals shown include unvalidated device data.  Last Pain:  Vitals:   12/17/21 0624  TempSrc:   PainSc: 0-No pain         Complications: No notable events documented.

## 2021-12-17 NOTE — Interval H&P Note (Signed)
History and Physical Interval Note:  12/17/2021 8:41 AM  Douglas Gay  has presented today for surgery, with the diagnosis of Right knee osteoarthritis.  The various methods of treatment have been discussed with the patient and family. After consideration of risks, benefits and other options for treatment, the patient has consented to  Procedure(s) with comments: TOTAL KNEE ARTHROPLASTY (Right) - with adductor canal as a surgical intervention.  The patient's history has been reviewed, patient examined, no change in status, stable for surgery.  I have reviewed the patient's chart and labs.  Questions were answered to the patient's satisfaction.     Douglas Gay ANDREW

## 2021-12-17 NOTE — Interval H&P Note (Signed)
History and Physical Interval Note:  12/17/2021 8:41 AM  Douglas Gay  has presented today for surgery, with the diagnosis of Right knee osteoarthritis.  The various methods of treatment have been discussed with the patient and family. After consideration of risks, benefits and other options for treatment, the patient has consented to  Procedure(s) with comments: TOTAL KNEE ARTHROPLASTY (Right) - with adductor canal as a surgical intervention.  The patient's history has been reviewed, patient examined, no change in status, stable for surgery.  I have reviewed the patient's chart and labs.  Questions were answered to the patient's satisfaction.     [name]

## 2021-12-17 NOTE — Anesthesia Preprocedure Evaluation (Signed)
Anesthesia Evaluation  Patient identified by MRN, date of birth, ID band Patient awake    Reviewed: Allergy & Precautions, NPO status , Patient's Chart, lab work & pertinent test results  Airway Mallampati: II  TM Distance: >3 FB Neck ROM: Full    Dental no notable dental hx.    Pulmonary neg pulmonary ROS, former smoker,    Pulmonary exam normal        Cardiovascular hypertension, Pt. on medications + Peripheral Vascular Disease and + DVT (X3, on Xarelto, last dose 12/13/21)   Rhythm:Regular Rate:Normal     Neuro/Psych Anxiety Depression negative neurological ROS     GI/Hepatic Neg liver ROS, GERD  Medicated,  Endo/Other    Renal/GU      Musculoskeletal  (+) Arthritis , Osteoarthritis,    Abdominal Normal abdominal exam  (+)   Peds  Hematology  (+) Blood dyscrasia, anemia , Factor V Leiden   Anesthesia Other Findings   Reproductive/Obstetrics                             Anesthesia Physical Anesthesia Plan  ASA: 3  Anesthesia Plan: MAC, Regional and Spinal   Post-op Pain Management: Regional block*   Induction: Intravenous  PONV Risk Score and Plan: 1 and Ondansetron, Dexamethasone, Propofol infusion and Treatment may vary due to age or medical condition  Airway Management Planned: Simple Face Mask, Natural Airway and Nasal Cannula  Additional Equipment: None  Intra-op Plan:   Post-operative Plan:   Informed Consent: I have reviewed the patients History and Physical, chart, labs and discussed the procedure including the risks, benefits and alternatives for the proposed anesthesia with the patient or authorized representative who has indicated his/her understanding and acceptance.     Dental advisory given  Plan Discussed with: CRNA  Anesthesia Plan Comments: (Lab Results      Component                Value               Date                      WBC                       5.2                 12/04/2021                HGB                      12.1 (L)            12/04/2021                HCT                      36.9 (L)            12/04/2021                MCV                      91.1                12/04/2021                PLT  237                 12/04/2021           Lab Results      Component                Value               Date                      NA                       138                 12/04/2021                K                        3.8                 12/04/2021                CO2                      24                  12/04/2021                GLUCOSE                  109 (H)             12/04/2021                BUN                      25 (H)              12/04/2021                CREATININE               1.30 (H)            12/04/2021                CALCIUM                  9.8                 12/04/2021                GFRNONAA                 57 (L)              12/04/2021           Lab Results      Component                Value               Date                      INR                      1.3 (H)             12/17/2021  INR                      1.2                 01/01/2021                INR                      1.07                03/13/2015          )        Anesthesia Quick Evaluation

## 2021-12-17 NOTE — Anesthesia Procedure Notes (Signed)
Procedure Name: MAC Date/Time: 12/17/2021 8:51 AM Performed by: Eben Burow, CRNA Pre-anesthesia Checklist: Patient identified, Emergency Drugs available, Suction available, Patient being monitored and Timeout performed Oxygen Delivery Method: Simple face mask Placement Confirmation: positive ETCO2

## 2021-12-17 NOTE — Anesthesia Postprocedure Evaluation (Signed)
Anesthesia Post Note  Patient: Douglas Gay  Procedure(s) Performed: TOTAL KNEE ARTHROPLASTY (Right: Knee)     Patient location during evaluation: PACU Anesthesia Type: Regional and General Level of consciousness: awake and alert Pain management: pain level controlled Vital Signs Assessment: post-procedure vital signs reviewed and stable Respiratory status: spontaneous breathing, nonlabored ventilation, respiratory function stable and patient connected to nasal cannula oxygen Cardiovascular status: blood pressure returned to baseline and stable Postop Assessment: no apparent nausea or vomiting Anesthetic complications: no Comments: Spinal converted to general    No notable events documented.  Last Vitals:  Vitals:   12/17/21 1444 12/17/21 1834  BP: (!) 158/84 (!) 154/93  Pulse: 94 (!) 106  Resp: 20 20  Temp: 36.7 C 36.6 C  SpO2: 100% 95%    Last Pain:  Vitals:   12/17/21 1910  TempSrc:   PainSc: 8                  Florabelle Cardin P Michille Mcelrath

## 2021-12-17 NOTE — Op Note (Signed)
DATE OF SURGERY:  12/17/2021  TIME: 10:52 AM  PATIENT NAME:  Douglas Gay    AGE: 76 y.o.   PRE-OPERATIVE DIAGNOSIS:  Right knee osteoarthritis  POST-OPERATIVE DIAGNOSIS:  Right knee osteoarthritis  PROCEDURE:  Procedure(s): TOTAL KNEE ARTHROPLASTY  SURGEON:  Bryson Palen ANDREW  ASSISTANT:  Leeanne Haus, PA-C, present and scrubbed throughout the case, critical for assistance with exposure, retraction, instrumentation, and closure.  OPERATIVE IMPLANTS: Depuy PFC Attune Rotating Platform.  Femur size 7, Tibia size 8, Patella size 38 3-peg oval button, with a 8 mm polyethylene insert.   PREOPERATIVE INDICATIONS:   Douglas Gay is a 76 y.o. year old male with end stage bone on bone arthritis of the knee who failed conservative treatment and elected for Total Knee Arthroplasty.   The risks, benefits, and alternatives were discussed at length including but not limited to the risks of infection, bleeding, nerve injury, stiffness, blood clots, the need for revision surgery, cardiopulmonary complications, among others, and they were willing to proceed.  OPERATIVE DESCRIPTION:  The patient was brought to the operative room and placed in a supine position.  Spinal anesthesia was administered.  IV antibiotics were given.  The lower extremity was prepped and draped in the usual sterile fashion.  Time out was performed.  The leg was elevated and exsanguinated and the tourniquet was inflated.  Anterior quadriceps tendon splitting approach was performed.  The patella was retracted and osteophytes were removed.  The anterior horn of the medial and lateral meniscus was removed and cruciate ligaments resected.   The distal femur was opened with the drill and the intramedullary distal femoral cutting jig was utilized, set at 5 degrees resecting 10 mm off the distal femur.  Care was taken to protect the collateral ligaments.  The distal femoral sizing jig was applied, taking care to avoid  notching.  Then the 4-in-1 cutting jig was applied and the anterior and posterior femur was cut, along with the chamfer cuts.    Then the extramedullary tibial cutting jig was utilized making the appropriate cut using the anterior tibial crest as a reference building in appropriate posterior slope.  Care was taken during the cut to protect the medial and collateral ligaments.  The proximal tibia was removed along with the posterior horns of the menisci.   The posterior medial femoral osteophytes and posterior lateral femoral osteophytes were removed.    The flexion gap was then measured and was symmetric with the extension gap, measured at 8.  I completed the distal femoral preparation using the appropriate jig to prepare the box.  The patella was then measured, and cut with the saw.    The proximal tibia sized and prepared accordingly with the reamer and the punch, and then all components were trialed with the trial insert.  The knee was found to have excellent balance and full motion.    The above named components were then cemented into place and all excess cement was removed.  The trial polyethylene component was in place during cementation, and then was exchanged for the real polyethylene component.    The knee was easily taken through a range of motion and the patella tracked well and the knee irrigated copiously and the parapatellar and subcutaneous tissue closed with vicryl, and monocryl with steri strips for the skin.  The arthrotomy was closed at 90 of flexion. The wounds were dressed with sterile gauze and the tourniquet released and the patient was awakened and returned to the PACU in  stable and satisfactory condition.  There were no complications.  Total tourniquet time was 79 minutes.

## 2021-12-17 NOTE — Evaluation (Signed)
Physical Therapy Evaluation Patient Details Name: Douglas Gay MRN: [id number] DOB: 1946-07-12 Today's Date: 12/17/2021  History of Present Illness  76 yo male s/p R TKA 2/23. 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  Clinical Impression  On eval POD 0, pt was Supv-Min guard assist for mobility. He walked ~75 feet with a RW. Moderate pain with activity. Anticipate pt will progress well.        Recommendations for follow up therapy are one component of a multi-disciplinary discharge planning process, led by the attending physician.  Recommendations may be updated based on patient status, additional functional criteria and insurance authorization.  Follow Up Recommendations Follow physician's recommendations for discharge plan and follow up therapies    Assistance Recommended at Discharge PRN  Patient can return home with the following  A little help with walking and/or transfers;A little help with bathing/dressing/bathroom;Assist for transportation;Help with stairs or ramp for entrance;Assistance with cooking/housework    Equipment Recommendations None recommended by PT  Recommendations for Other Services       Functional Status Assessment Patient has had a recent decline in their functional status and demonstrates the ability to make significant improvements in function in a reasonable and predictable amount of time.     Precautions / Restrictions Precautions Precautions: Fall;Knee Restrictions Weight Bearing Restrictions: No Other Position/Activity Restrictions: WBAT      Mobility  Bed Mobility Overal bed mobility: Needs Assistance Bed Mobility: Supine to Sit     Supine to sit: Supervision, HOB elevated     General bed mobility comments: Supv for safety.    Transfers Overall transfer level: Needs assistance Equipment used: Rolling walker (2 wheels) Transfers: Sit to/from Stand Sit to Stand: Supervision           General transfer comment: Supv for safety, hand/LE placement.     Ambulation/Gait Ambulation/Gait assistance: Min guard Gait Distance (Feet): 75 Feet Assistive device: Rolling walker (2 wheels) Gait Pattern/deviations: Step-to pattern       General Gait Details: Cues for safety, sequence. No LOB with RW. Pt denied dizziness. Tolerated distance well.  Stairs            Wheelchair Mobility    Modified Rankin (Stroke Patients Only)       Balance Overall balance assessment: Needs assistance         Standing balance support: Bilateral upper extremity supported, During functional activity, Reliant on assistive device for balance Standing balance-Leahy Scale: Fair                               Pertinent Vitals/Pain Pain Assessment Pain Assessment: 0-10 Pain Score: 7  Pain Location: R knee Pain Descriptors / Indicators: Discomfort, Sore Pain Intervention(s): Limited activity within patient's tolerance, Monitored during session, Ice applied, Repositioned    Home Living Family/patient expects to be discharged to:: Private residence Living Arrangements: Spouse/significant other Available Help at Discharge: Family Type of Home: House Home Access: Stairs to enter Entrance Stairs-Rails: None Entrance Stairs-Number of Steps: 1   Home Layout: One level Home Equipment: Conservation officer, nature (2 wheels);Cane - single point;Hand held shower head;Shower seat      Prior Function Prior Level of Function : Independent/Modified Independent                     Hand Dominance        Extremity/Trunk Assessment   Upper Extremity Assessment Upper Extremity Assessment: Overall WFL for tasks assessed  Lower Extremity Assessment Lower Extremity Assessment: Generalized weakness    Cervical / Trunk Assessment Cervical / Trunk Assessment: Normal  Communication   Communication: No difficulties  Cognition Arousal/Alertness: Awake/alert Behavior During Therapy: WFL for tasks assessed/performed Overall Cognitive Status:  Within Functional Limits for tasks assessed                                          General Comments      Exercises Total Joint Exercises Quad Sets: AROM, Right (3 reps with me (instructed him to do 1 set of 10 reps in recliner then another set once back in bed))   Assessment/Plan    PT Assessment Patient needs continued PT services  PT Problem List Decreased strength;Decreased mobility;Decreased range of motion;Decreased activity tolerance;Decreased balance;Decreased knowledge of use of DME;Pain       PT Treatment Interventions DME instruction;Therapeutic exercise;Gait training;Balance training;Stair training;Functional mobility training;Therapeutic activities;Patient/family education    PT Goals (Current goals can be found in the Care Plan section)  Acute Rehab PT Goals Patient Stated Goal: regain PLOF PT Goal Formulation: With patient Time For Goal Achievement: 12/31/21 Potential to Achieve Goals: Good    Frequency 7X/week     Co-evaluation               AM-PAC PT "6 Clicks" Mobility  Outcome Measure Help needed turning from your back to your side while in a flat bed without using bedrails?: A Little Help needed moving from lying on your back to sitting on the side of a flat bed without using bedrails?: A Little Help needed moving to and from a bed to a chair (including a wheelchair)?: A Little Help needed standing up from a chair using your arms (e.g., wheelchair or bedside chair)?: A Little Help needed to walk in hospital room?: A Little Help needed climbing 3-5 steps with a railing? : A Little 6 Click Score: 18    End of Session Equipment Utilized During Treatment: Gait belt Activity Tolerance: Patient tolerated treatment well Patient left: in chair;with call bell/phone within reach;with family/visitor present;with chair alarm set   PT Visit Diagnosis: Other abnormalities of gait and mobility (R26.89);Pain Pain - Right/Left: Right Pain -  part of body: Knee    Time: 1601-1620 PT Time Calculation (min) (ACUTE ONLY): 19 min   Charges:   PT Evaluation $PT Eval Low Complexity: [place], PT Acute Rehabilitation  Office: [phone] Pager: [phone]

## 2021-12-17 NOTE — Anesthesia Procedure Notes (Signed)
Anesthesia Regional Block: Adductor canal block   Pre-Anesthetic Checklist: , timeout performed,  Correct Patient, Correct Site, Correct Laterality,  Correct Procedure, Correct Position, site marked,  Risks and benefits discussed,  Surgical consent,  Pre-op evaluation,  At surgeon's request and post-op pain management  Laterality: Right  Prep: Dura Prep       Needles:  Injection technique: Single-shot  Needle Type: Echogenic Stimulator Needle     Needle Length: 10cm  Needle Gauge: 20     Additional Needles:   Procedures:,,,, ultrasound used (permanent image in chart),,    Narrative:  Start time: 12/17/2021 7:40 AM End time: 12/17/2021 7:45 AM Injection made incrementally with aspirations every 5 mL.  Performed by: Personally  Anesthesiologist: Darral Dash, DO  Additional Notes: Patient identified. Risks/Benefits/Options discussed with patient including but not limited to bleeding, infection, nerve damage, failed block, incomplete pain control. Patient expressed understanding and wished to proceed. All questions were answered. Sterile technique was used throughout the entire procedure. Please see nursing notes for vital signs. Aspirated in 5cc intervals with injection for negative confirmation. Patient was given instructions on fall risk and not to get out of bed. All questions and concerns addressed with instructions to call with any issues or inadequate analgesia.

## 2021-12-18 ENCOUNTER — Encounter (HOSPITAL_COMMUNITY): Payer: Self-pay | Admitting: Specialist

## 2021-12-18 DIAGNOSIS — M1711 Unilateral primary osteoarthritis, right knee: Secondary | ICD-10-CM | POA: Diagnosis not present

## 2021-12-18 MED ORDER — ONDANSETRON HCL 4 MG PO TABS: 4.0000 mg | ORAL_TABLET | Freq: Four times a day (QID) | ORAL | 0 refills | Status: AC | PRN

## 2021-12-18 MED ORDER — METHOCARBAMOL 500 MG PO TABS
500.0000 mg | ORAL_TABLET | Freq: Four times a day (QID) | ORAL | 0 refills | Status: AC | PRN
Start: 2021-12-18 — End: ?

## 2021-12-18 MED ORDER — TRAMADOL HCL 50 MG PO TABS
50.0000 mg | ORAL_TABLET | Freq: Four times a day (QID) | ORAL | 0 refills | Status: AC
Start: 2021-12-18 — End: ?

## 2021-12-18 MED ORDER — OXYCODONE HCL 5 MG PO TABS: 5.0000 mg | ORAL_TABLET | ORAL | 0 refills | Status: AC | PRN

## 2021-12-18 NOTE — Discharge Summary (Signed)
Physician Discharge Summary  Patient ID: Douglas Gay MRN: [id number] DOB/AGE: 01-15-1946 76 y.o.  Admit date: 12/17/2021 Discharge date: 12/18/2021  Admission Diagnoses: Right knee end-stage osteoarthritis  Discharge Diagnoses:  Principal Problem:   Osteoarthritis of right knee   Discharged Condition: good  Hospital Course: Patient was admitted on February 22 for right knee end-stage osteoarthritis for a right total knee arthroplasty.  Patient tolerated surgery well.  He was sent to PACU in postop floor in stable condition.  Postop day 0 worked with physical therapy was able to get up and ambulate with help of walker.  Pain is pretty well controlled with p.o. pain medication.  No events overnight.  Postop day 1 patient doing well.  He was able to get up with therapy again.  He was able to be discharged home.  He was sent home with pain medication, muscle relaxer, nausea medication.  He is going to follow-up with [place] in the office 2 weeks after surgery.  DVT prophylaxis will be resuming his home anticoagulation medication, Xarelto and aspirin He will continue in the Ace bandage for an additional 24 to 48 hours and then can transition to compression stocking We will start physical therapy outpatient on Monday  Consults: None  Significant Diagnostic Studies: none  Treatments: IV hydration, antibiotics: Ancef, analgesia: acetaminophen, Dilaudid, and oxycodone, anticoagulation: Xarelto, therapies: PT, and surgery: right TKA  Discharge Exam: Blood pressure (!) 158/81, pulse 86, temperature 97.8 F (36.6 C), resp. rate 16, height 5\' 10"  (1.778 m), weight 81.8 kg, SpO2 99 %. General appearance: alert, cooperative, appears stated age, and no distress Extremities: extremities normal, atraumatic, no cyanosis or edema, Homans sign is negative, no sign of DVT, and Limited ROM of right knee. Able to hold SLR, able to dorsiflex, plantar flex right ankle Pulses: 2+ and symmetric Skin: Skin color,  texture, turgor normal. No rashes or lesions Neurologic: Grossly normal Incision/Wound: Dressings clean, dry and intact  Disposition: Discharge disposition: 01-Home or Self Care       Discharge Instructions     Call MD / Call 911   Complete by: As directed    If you experience chest pain or shortness of breath, CALL 911 and be transported to the hospital emergency room.  If you develope a fever above 101 F, pus (white drainage) or increased drainage or redness at the wound, or calf pain, call your surgeon's office.   Constipation Prevention   Complete by: As directed    Drink plenty of fluids.  Prune juice may be helpful.  You may use a stool softener, such as Colace (over the counter) 100 mg twice a day.  Use MiraLax (over the counter) for constipation as needed.   Diet - low sodium heart healthy   Complete by: As directed    Discharge instructions   Complete by: As directed    Dr. Sydnee Cabal [facility] [address] [place], [place] [address] [phone]  TOTAL KNEE REPLACEMENT POSTOPERATIVE DIRECTIONS  Knee Rehabilitation, Guidelines Following Surgery  Results after knee surgery are often greatly improved when you follow the exercise, range of motion and muscle strengthening exercises prescribed by your doctor. Safety measures are also important to protect the knee from further injury. Any time any of these exercises cause you to have increased pain or swelling in your knee joint, decrease the amount until you are comfortable again and slowly increase them. If you have problems or questions, call your caregiver or physical therapist for advice.  HOME CARE INSTRUCTIONS  Remove items at home which could result in a fall. This includes throw rugs or furniture in walking pathways.  ICE to the affected knee every three hours for 30 minutes at a time and then as needed for pain and swelling.  Continue to use ice on the knee for pain and swelling from surgery. You  may notice swelling that will progress down to the foot and ankle.  This is normal after surgery.  Elevate the leg when you are not up walking on it.   Continue to use the breathing machine which will help keep your temperature down.  It is common for your temperature to cycle up and down following surgery, especially at night when you are not up moving around and exerting yourself.  The breathing machine keeps your lungs expanded and your temperature down. Do not place pillow under knee, focus on keeping the knee straight while resting   DIET You may resume your previous home diet once your are discharged from the hospital.  DRESSING / WOUND CARE / SHOWERING Keep the surgical dressing until follow up.  The dressing is water proof, but you need to put extra covering over it like plastic wrap.  IF THE DRESSING FALLS OFF or the wound gets wet inside, change the dressing with sterile gauze.  Please use good hand washing techniques before changing the dressing.  Do not use any lotions or creams on the incision until instructed by your surgeon.   You may start showering once you are discharged home but do not submerge the incision under water.  You are sent home with an ACE bandage on over the leg, this can be removed at 3 days from surgery. At this time you can start showering. Please place the white TED stocking on the surgical leg after. This needs to be worn on the surgical leg for 2 weeks after surgery.   ACTIVITY Walk with your walker as instructed. Use walker as long as suggested by your caregivers. Avoid periods of inactivity such as sitting longer than an hour when not asleep. This helps prevent blood clots.  You may resume a sexual relationship in one month or when given the OK by your doctor.  You may return to work once you are cleared by your doctor.  Do not drive a car for 6 weeks or until released by you surgeon.  Do not drive while taking narcotics.  WEIGHT BEARING Weight bearing as  tolerated with assist device (walker, cane, etc) as directed, use it as long as suggested by your surgeon or therapist, typically at least 4-6 weeks.  POSTOPERATIVE CONSTIPATION PROTOCOL Constipation - defined medically as fewer than three stools per week and severe constipation as less than one stool per week.  One of the most common issues patients have following surgery is constipation.  Even if you have a regular bowel pattern at home, your normal regimen is likely to be disrupted due to multiple reasons following surgery.  Combination of anesthesia, postoperative narcotics, change in appetite and fluid intake all can affect your bowels.  In order to avoid complications following surgery, here are some recommendations in order to help you during your recovery period.  Colace (docusate) - Pick up an over-the-counter form of Colace or another stool softener and take twice a day as long as you are requiring postoperative pain medications.  Take with a full glass of water daily.  If you experience loose stools or diarrhea, hold the colace until  you stool forms back up.  If your symptoms do not get better within 1 week or if they get worse, check with your doctor.  Dulcolax (bisacodyl) - Pick up over-the-counter and take as directed by the product packaging as needed to assist with the movement of your bowels.  Take with a full glass of water.  Use this product as needed if not relieved by Colace only.   MiraLax (polyethylene glycol) - Pick up over-the-counter to have on hand.  MiraLax is a solution that will increase the amount of water in your bowels to assist with bowel movements.  Take as directed and can mix with a glass of water, juice, soda, coffee, or tea.  Take if you go more than two days without a movement. Do not use MiraLax more than once per day. Call your doctor if you are still constipated or irregular after using this medication for 7 days in a row.  If you continue to have problems  with postoperative constipation, please contact the office for further assistance and recommendations.  If you experience "the worst abdominal pain ever" or develop nausea or vomiting, please contact the office immediatly for further recommendations for treatment.  ITCHING  If you experience itching with your medications, try taking only a single pain pill, or even half a pain pill at a time.  You can also use Benadryl over the counter for itching or also to help with sleep.   TED HOSE STOCKINGS Wear the elastic stockings on both legs for two weeks following surgery during the day but you may remove then at night for sleeping.  Okay to remove ACE in 3 days, put TED on after  MEDICATIONS See your medication summary on the "After Visit Summary" that the nursing staff will review with you prior to discharge.  You may have some home medications which will be placed on hold until you complete the course of blood thinner medication.  It is important for you to complete the blood thinner medication as prescribed by your surgeon.  Continue your approved medications as instructed at time of discharge. If you were not previously taking any blood thinners prior to surgery please start taking Aspirin 325 mg tabs twice daily for 6 weeks. If you are unable to take Aspirin please let your doctor know.   PRECAUTIONS If you experience chest pain or shortness of breath - call 911 immediately for transfer to the hospital emergency department.  If you develop a fever greater that 101 F, purulent drainage from wound, increased redness or drainage from wound, foul odor from the wound/dressing, or calf pain - CONTACT YOUR SURGEON.                                                   FOLLOW-UP APPOINTMENTS Make sure you keep all of your appointments after your operation with your surgeon and caregivers. You should call the office at the above phone number and make an appointment for approximately two weeks after the date of  your surgery or on the date instructed by your surgeon outlined in the "After Visit Summary".   RANGE OF MOTION AND STRENGTHENING EXERCISES  Rehabilitation of the knee is important following a knee injury or an operation. After just a few days of immobilization, the muscles of the thigh which control the knee become weakened and  shrink (atrophy). Knee exercises are designed to build up the tone and strength of the thigh muscles and to improve knee motion. Often times heat used for twenty to thirty minutes before working out will loosen up your tissues and help with improving the range of motion but do not use heat for the first two weeks following surgery. These exercises can be done on a training (exercise) mat, on the floor, on a table or on a bed. Use what ever works the best and is most comfortable for you Knee exercises include:  Leg Lifts - While your knee is still immobilized in a splint or cast, you can do straight leg raises. Lift the leg to 60 degrees, hold for 3 sec, and slowly lower the leg. Repeat 10-20 times 2-3 times daily. Perform this exercise against resistance later as your knee gets better.  Quad and Hamstring Sets - Tighten up the muscle on the front of the thigh (Quad) and hold for 5-10 sec. Repeat this 10-20 times hourly. Hamstring sets are done by pushing the foot backward against an object and holding for 5-10 sec. Repeat as with quad sets.  Leg Slides: Lying on your back, slowly slide your foot toward your buttocks, bending your knee up off the floor (only go as far as is comfortable). Then slowly slide your foot back down until your leg is flat on the floor again. Angel Wings: Lying on your back spread your legs to the side as far apart as you can without causing discomfort.  A rehabilitation program following serious knee injuries can speed recovery and prevent re-injury in the future due to weakened muscles. Contact your doctor or a physical therapist for more information on  knee rehabilitation.   IF YOU ARE TRANSFERRED TO A SKILLED REHAB FACILITY If the patient is transferred to a skilled rehab facility following release from the hospital, a list of the current medications will be sent to the facility for the patient to continue.  When discharged from the skilled rehab facility, please have the facility set up the patient's [place] prior to being released. Also, the skilled facility will be responsible for providing the patient with their medications at time of release from the facility to include their pain medication, the muscle relaxants, and their blood thinner medication. If the patient is still at the rehab facility at time of the two week follow up appointment, the skilled rehab facility will also need to assist the patient in arranging follow up appointment in our office and any transportation needs.  MAKE SURE YOU:  Understand these instructions.  Get help right away if you are not doing well or get worse.    Pick up stool softner and laxative for home use following surgery while on pain medications. May shower starting three days after surgery. Please use a clean towel to pat the leg dry following showers. Continue to use ice for pain and swelling after surgery. Do not use any lotions or creams on the incision until instructed by you Start Aspirin immediately following surgery.   Do not put a pillow under the knee. Place it under the heel.   Complete by: As directed    Driving restrictions   Complete by: As directed    No driving for two weeks   Post-operative opioid taper instructions:   Complete by: As directed    POST-OPERATIVE OPIOID TAPER INSTRUCTIONS: It is important to wean off of your opioid medication as soon as possible. If  you do not need pain medication after your surgery it is ok to stop day one. Opioids include: Codeine, Hydrocodone(Norco, Vicodin), Oxycodone(Percocet, oxycontin) and hydromorphone amongst others.   Long term and even short term use of opiods can cause: Increased pain response Dependence Constipation Depression Respiratory depression And more.  Withdrawal symptoms can include Flu like symptoms Nausea, vomiting And more Techniques to manage these symptoms Hydrate well Eat regular healthy meals Stay active Use relaxation techniques(deep breathing, meditating, yoga) Do Not substitute Alcohol to help with tapering If you have been on opioids for less than two weeks and do not have pain than it is ok to stop all together.  Plan to wean off of opioids This plan should start within one week post op of your joint replacement. Maintain the same interval or time between taking each dose and first decrease the dose.  Cut the total daily intake of opioids by one tablet each day Next start to increase the time between doses. The last dose that should be eliminated is the evening dose.      TED hose   Complete by: As directed    Use stockings (TED hose) for three weeks on both leg(s).  You may remove them at night for sleeping.   Weight bearing as tolerated   Complete by: As directed       Allergies as of 12/18/2021   No Known Allergies      Medication List     TAKE these medications    amLODipine 2.5 MG tablet Commonly known as: NORVASC Take 2.5 mg by mouth daily.   ascorbic acid 500 MG tablet Commonly known as: VITAMIN C Take 500 mg by mouth 2 (two) times daily.   aspirin EC 81 MG tablet Take 81 mg by mouth daily. Swallow whole.   calcium carbonate 1250 (500 Ca) MG chewable tablet Commonly known as: OS-CAL Chew 1 tablet by mouth 2 (two) times daily.   cholecalciferol 25 MCG (1000 UNIT) tablet Commonly known as: VITAMIN D3 Take 1,000 Units by mouth in the morning and at bedtime.   diphenhydramine-acetaminophen 25-500 MG Tabs tablet Commonly known as: TYLENOL PM Take 1 tablet by mouth at bedtime as needed (sleep).   escitalopram 10 MG tablet Commonly known  as: LEXAPRO Take 10 mg by mouth daily.   ferrous sulfate 325 (65 FE) MG tablet Take 325 mg by mouth 2 (two) times daily with a meal.   folic acid 944 MCG tablet Commonly known as: FOLVITE Take 400 mcg by mouth daily.   lisinopril 40 MG tablet Commonly known as: ZESTRIL Take 40 mg by mouth daily.   methocarbamol 500 MG tablet Commonly known as: ROBAXIN Take 1 tablet (500 mg total) by mouth every 6 (six) hours as needed for muscle spasms.   omeprazole 20 MG capsule Commonly known as: PRILOSEC Take 20 mg by mouth every morning.   ondansetron 4 MG tablet Commonly known as: ZOFRAN Take 1 tablet (4 mg total) by mouth every 6 (six) hours as needed for nausea.   oxyCODONE 5 MG immediate release tablet Commonly known as: Oxy IR/ROXICODONE Take 1-2 tablets (5-10 mg total) by mouth every 4 (four) hours as needed for moderate pain (pain score 4-6).   Refresh Relieva 0.5-0.9 % ophthalmic solution Generic drug: carboxymethylcellul-glycerin Place 1 drop into both eyes in the morning and at bedtime.   rivaroxaban 20 MG Tabs tablet Commonly known as: XARELTO Take 20 mg by mouth daily with supper.   rOPINIRole 0.25 MG tablet  Commonly known as: REQUIP Take 0.25 mg by mouth at bedtime.   rosuvastatin 20 MG tablet Commonly known as: CRESTOR Take 20 mg by mouth every morning.   traMADol 50 MG tablet Commonly known as: ULTRAM Take 1 tablet (50 mg total) by mouth every 6 (six) hours.               Discharge Care Instructions  (From admission, onward)           Start     Ordered   12/18/21 0000  Weight bearing as tolerated        12/18/21 0630             Signed: Nettie Elm [facility] [phone] 12/18/2021, 6:30 AM

## 2021-12-18 NOTE — Progress Notes (Signed)
Physical Therapy Treatment Patient Details Name: Douglas Gay MRN: [id number] DOB: Nov 24, 1945 Today's Date: 12/18/2021   History of Present Illness 76 yo male s/p R TKA 2/23. 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    PT Comments    Patient progressing well with acute PT and ambulated ~180' with RW and supervision for safety. He demonstrated good safety awareness for walker management with transfers and gait. Pt instructed on stair negotiation for curb and verbalized safe understanding for family to assist. Educated on HEP and addressed questions for discharge. Pt is at safe level to discharge home with assist from family.    Recommendations for follow up therapy are one component of a multi-disciplinary discharge planning process, led by the attending physician.  Recommendations may be updated based on patient status, additional functional criteria and insurance authorization.  Follow Up Recommendations  Follow physician's recommendations for discharge plan and follow up therapies     Assistance Recommended at Discharge PRN  Patient can return home with the following A little help with walking and/or transfers;A little help with bathing/dressing/bathroom;Assist for transportation;Help with stairs or ramp for entrance;Assistance with cooking/housework   Equipment Recommendations  None recommended by PT    Recommendations for Other Services       Precautions / Restrictions Precautions Precautions: Fall;Knee Restrictions Weight Bearing Restrictions: No RLE Weight Bearing: Weight bearing as tolerated     Mobility  Bed Mobility Overal bed mobility: Needs Assistance Bed Mobility: Supine to Sit     Supine to sit: Supervision, HOB elevated     General bed mobility comments: Supv for safety. extra time and use of bed rail.    Transfers Overall transfer level: Needs assistance Equipment used: Rolling walker (2 wheels) Transfers: Sit to/from Stand Sit to Stand: Supervision           General  transfer comment: Supv for safety, good recall for safe hand/LE placement.    Ambulation/Gait Ambulation/Gait assistance: Supervision Gait Distance (Feet): 180 Feet Assistive device: Rolling walker (2 wheels) Gait Pattern/deviations: Step-to pattern, Step-through pattern, Decreased weight shift to right Gait velocity: fair     General Gait Details: pt with good safety awareness for RW management, no LOB noted and no buckling at Rt knee.   Stairs Stairs: Yes Stairs assistance: Min guard, Supervision Stair Management: No rails, Step to pattern, Forwards, With walker Number of Stairs: 1 General stair comments: VCs for safe sequencing with RW placement and step patter "up with good, down with bad". no LOB noted and pt verbalized understanding of guarding for family to provide.   Wheelchair Mobility    Modified Rankin (Stroke Patients Only)       Balance Overall balance assessment: Needs assistance Sitting-balance support: Feet supported Sitting balance-Leahy Scale: Good     Standing balance support: Bilateral upper extremity supported, During functional activity, Reliant on assistive device for balance Standing balance-Leahy Scale: Fair                              Cognition Arousal/Alertness: Awake/alert Behavior During Therapy: WFL for tasks assessed/performed Overall Cognitive Status: Within Functional Limits for tasks assessed                                          Exercises Total Joint Exercises Ankle Circles/Pumps: AROM, Both, 10 reps, Seated Quad Sets: AROM, Right, 5 reps,  Seated Short Arc Quad: AROM, Right, Other reps (comment), Seated (4) Heel Slides: AROM, Right, 5 reps, Seated    General Comments        Pertinent Vitals/Pain Pain Assessment Pain Assessment: 0-10 Pain Score: 5  Pain Location: R knee Pain Descriptors / Indicators: Discomfort, Sore Pain Intervention(s): Limited activity within patient's tolerance,  Monitored during session, Repositioned, Premedicated before session    Home Living                          Prior Function            PT Goals (current goals can now be found in the care plan section) Acute Rehab PT Goals Patient Stated Goal: regain PLOF PT Goal Formulation: With patient Time For Goal Achievement: 12/31/21 Potential to Achieve Goals: Good Progress towards PT goals: Progressing toward goals    Frequency    7X/week      PT Plan Current plan remains appropriate    Co-evaluation              AM-PAC PT "6 Clicks" Mobility   Outcome Measure  Help needed turning from your back to your side while in a flat bed without using bedrails?: None Help needed moving from lying on your back to sitting on the side of a flat bed without using bedrails?: A Little Help needed moving to and from a bed to a chair (including a wheelchair)?: A Little Help needed standing up from a chair using your arms (e.g., wheelchair or bedside chair)?: A Little Help needed to walk in hospital room?: A Little Help needed climbing 3-5 steps with a railing? : A Little 6 Click Score: 19    End of Session Equipment Utilized During Treatment: Gait belt Activity Tolerance: Patient tolerated treatment well Patient left: in chair;with call bell/phone within reach;with chair alarm set Nurse Communication: Mobility status PT Visit Diagnosis: Other abnormalities of gait and mobility (R26.89);Pain Pain - Right/Left: Right Pain - part of body: Knee     Time: 6063-0160 PT Time Calculation (min) (ACUTE ONLY): 25 min  Charges:  $Gait Training: 8-22 mins $Therapeutic Exercise: 8-22 mins                     Verner Mould, DPT Acute Rehabilitation Services Office [phone] Pager [phone]     Jacques Navy 12/18/2021, 12:33 PM

## 2021-12-18 NOTE — TOC Transition Note (Signed)
Transition of Care (TOC) - CM/SW Discharge Note ° °Patient Details  °Name: Douglas Gay °MRN: [id number] °Date of Birth: 10/14/1946 ° °Transition of Care (TOC) CM/SW Contact:  °Megan S Glenn, LCSW °Phone Number: °12/18/2021, 9:27 AM ° °Clinical Narrative: Patient is expected to discharge home after working with PT. CSW met with patient to confirm discharge plan. Patient will discharge home with OPPT at [facility] with the first appointment scheduled for 12/22/21. Patient has a rolling walker and toilet riser at home so there are no DME needs at this time. TOC signing off. ° °Final next level of care: OP Rehab °Barriers to Discharge: No Barriers Identified ° °Patient Goals and CMS Choice °Patient states their goals for this hospitalization and ongoing recovery are:: Discharge home with OPPT at [facility] °Choice offered to / list presented to : NA ° °Discharge Plan and Services       °DME Arranged: N/A °DME Agency: NA ° °Readmission Risk Interventions °No flowsheet data found. ° °

## 2021-12-18 NOTE — Progress Notes (Signed)
Subjective: 1 Day Post-Op Procedure(s) (LRB): TOTAL KNEE ARTHROPLASTY (Right) Patient reports pain as 9 on 0-10 scale.  Pain does decrease with PO pain medications + for void, - flatus Was able to get up with PT yesterday and ambulate with help of a walker Denies any issues overnight  Objective: Vital signs in last 24 hours: Temp:  [97.5 F (36.4 C)-98 F (36.7 C)] 98 F (36.7 C) (02/23 0142) Pulse Rate:  [76-106] 97 (02/23 0142) Resp:  [10-20] 17 (02/23 0142) BP: (141-158)/(78-93) 158/87 (02/23 0142) SpO2:  [95 %-100 %] 99 % (02/23 0142) Weight:  [81.8 kg] 81.8 kg (02/22 1300)  Intake/Output from previous day: 02/22 0701 - 02/23 0700 In: 3722.2 [P.O.:1080; I.V.:2363.1; IV Piggyback:279.1] Out: 700 [Urine:650; Blood:50] Intake/Output this shift: Total I/O In: 964.5 [P.O.:600; I.V.:335.4; IV Piggyback:29.1] Out: 400 [Urine:400]  No results for input(s): HGB in the last 72 hours. No results for input(s): WBC, RBC, HCT, PLT in the last 72 hours. No results for input(s): NA, K, CL, CO2, BUN, CREATININE, GLUCOSE, CALCIUM in the last 72 hours. Recent Labs    12/17/21 0625  INR 1.3*    Neurologically intact Neurovascular intact Sensation intact distally Intact pulses distally Dorsiflexion/Plantar flexion intact Incision: dressing C/D/I No cellulitis present Compartment soft   Assessment/Plan: 1 Day Post-Op Procedure(s) (LRB): TOTAL KNEE ARTHROPLASTY (Right) Advance diet Up with therapy Patient can continue with weightbearing as tolerated DC home today, has therapy scheduled for Monday, already has walker at home He will follow-up in the office with [place] in 2 weeks DVT prophylaxis will be him resuming his home anticoagulation medication, Xarelto Medications have been sent to the pharmacy for the patient 10 you in the Ace bandage for another 24 to 48 hours, he can then remove the Ace bandage and placed on the compression stocking until follow-up appointment.  Continue  with Aquacel Continue with icing and elevating   Patient's anticipated LOS is less than 2 midnights, meeting these requirements: - Younger than 21 - Lives within 1 hour of care - Has a competent adult at home to recover with post-op recover - NO history of  - Chronic pain requiring opiods  - Diabetes  - Coronary Artery Disease  - Heart failure  - Heart attack  - Stroke  - DVT/VTE  - Cardiac arrhythmia  - Respiratory Failure/COPD  - Renal failure  - Anemia  - Advanced Liver disease     Derrick Ravel [phone] 12/18/2021, 6:23 AM

## 2023-12-02 ENCOUNTER — Emergency Department (HOSPITAL_BASED_OUTPATIENT_CLINIC_OR_DEPARTMENT_OTHER)
Admission: EM | Admit: 2023-12-02 | Discharge: 2023-12-02 | Disposition: A | Discharge status: Home / Self Care | Payer: Medicare Other | Attending: Emergency Medicine | Admitting: Emergency Medicine

## 2023-12-02 ENCOUNTER — Encounter (HOSPITAL_BASED_OUTPATIENT_CLINIC_OR_DEPARTMENT_OTHER): Payer: Self-pay

## 2023-12-02 ENCOUNTER — Other Ambulatory Visit: Payer: Self-pay

## 2023-12-02 ENCOUNTER — Emergency Department (HOSPITAL_BASED_OUTPATIENT_CLINIC_OR_DEPARTMENT_OTHER): Payer: Medicare Other

## 2023-12-02 DIAGNOSIS — R202 Paresthesia of skin: Secondary | ICD-10-CM | POA: Insufficient documentation

## 2023-12-02 DIAGNOSIS — Z7901 Long term (current) use of anticoagulants: Secondary | ICD-10-CM | POA: Diagnosis not present

## 2023-12-02 DIAGNOSIS — M79662 Pain in left lower leg: Secondary | ICD-10-CM | POA: Insufficient documentation

## 2023-12-02 DIAGNOSIS — Z7982 Long term (current) use of aspirin: Secondary | ICD-10-CM | POA: Insufficient documentation

## 2023-12-02 DIAGNOSIS — I998 Other disorder of circulatory system: Secondary | ICD-10-CM

## 2023-12-02 DIAGNOSIS — Z853 Personal history of malignant neoplasm of breast: Secondary | ICD-10-CM | POA: Diagnosis not present

## 2023-12-02 HISTORY — DX: Malignant neoplasm of unspecified site of unspecified female breast: C50.919

## 2023-12-02 LAB — BASIC METABOLIC PANEL
Anion gap: 7 (ref 5–15)
BUN: 26 mg/dL — ABNORMAL HIGH (ref 8–23)
CO2: 25 mmol/L (ref 22–32)
Calcium: 9.6 mg/dL (ref 8.9–10.3)
Chloride: 105 mmol/L (ref 98–111)
Creatinine, Ser: 1.41 mg/dL — ABNORMAL HIGH (ref 0.61–1.24)
GFR, Estimated: 51 mL/min — ABNORMAL LOW (ref >60)
Glucose, Bld: 93 mg/dL (ref 70–99)
Potassium: 4 mmol/L (ref 3.5–5.1)
Sodium: 137 mmol/L (ref 135–145)

## 2023-12-02 LAB — CBC WITH DIFFERENTIAL/PLATELET
Abs Immature Granulocytes: 0.06 10*3/uL (ref 0.00–0.07)
Basophils Absolute: 0 10*3/uL (ref 0.0–0.1)
Basophils Relative: 0 %
Eosinophils Absolute: 0.3 10*3/uL (ref 0.0–0.5)
Eosinophils Relative: 3 %
HCT: 32.7 % — ABNORMAL LOW (ref 39.0–52.0)
Hemoglobin: 10.9 g/dL — ABNORMAL LOW (ref 13.0–17.0)
Immature Granulocytes: 1 %
Lymphocytes Relative: 15 %
Lymphs Abs: 1.2 10*3/uL (ref 0.7–4.0)
MCH: 29.7 pg (ref 26.0–34.0)
MCHC: 33.3 g/dL (ref 30.0–36.0)
MCV: 89.1 fL (ref 80.0–100.0)
Monocytes Absolute: 0.9 10*3/uL (ref 0.1–1.0)
Monocytes Relative: 10 %
Neutro Abs: 6 10*3/uL (ref 1.7–7.7)
Neutrophils Relative %: 71 %
Platelets: 99 10*3/uL — ABNORMAL LOW (ref 150–400)
RBC: 3.67 MIL/uL — ABNORMAL LOW (ref 4.22–5.81)
RDW: 13.6 % (ref 11.5–15.5)
WBC: 8.4 10*3/uL (ref 4.0–10.5)
nRBC: 0 % (ref 0.0–0.2)

## 2023-12-02 MED ORDER — HEPARIN BOLUS VIA INFUSION
4700.0000 [IU] | Freq: Once | INTRAVENOUS | Status: AC
  Administered 2023-12-02: 4700 [IU] via INTRAVENOUS

## 2023-12-02 MED ORDER — IOHEXOL 350 MG/ML SOLN
100.0000 mL | Freq: Once | INTRAVENOUS | Status: AC | PRN
  Administered 2023-12-02: 125 mL via INTRAVENOUS

## 2023-12-02 MED ORDER — HEPARIN (PORCINE) 25000 UT/250ML-% IV SOLN
1300.0000 [IU]/h | INTRAVENOUS | Status: DC
  Administered 2023-12-02: 1300 [IU]/h via INTRAVENOUS
  Filled 2023-12-02: qty 250

## 2023-12-02 MED ORDER — MORPHINE SULFATE (PF) 4 MG/ML IV SOLN
4.0000 mg | Freq: Once | INTRAVENOUS | Status: AC
  Administered 2023-12-02: 4 mg via INTRAVENOUS
  Filled 2023-12-02: qty 1

## 2023-12-02 NOTE — ED Notes (Signed)
 Pt. Just in last week has had L breast removed due to L breast CA and has noted CDDressing with noted JP drain in place.

## 2023-12-02 NOTE — ED Provider Notes (Signed)
 Blood pressure (!) 165/84, pulse 71, temperature 98.1 F (36.7 C), temperature source Oral, resp. rate 18, height 5' 10 (1.778 m), weight 81.6 kg, SpO2 99%.  Assuming care from Dr. Elnor.  In short, Douglas Gay is a 78 y.o. male with a chief complaint of Leg Pain .  Refer to the original H&P for additional details.  The current plan of care is to follow up with [facility].  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  On my assessment he has no DP pulse with some slight blue discoloration to the toes.  No areas of necrosis.  No significant pain but some tingling/numb sensation in the right foot.  CT shows acute thrombus in the right iliac stent which was placed at [facility] 3 years prior.  Heparin  started by prior team.    04:22 PM  Spoke with Dr. Tollie at [facility].  Cussed the case.  They would like me to bolus heparin  here which I have ordered.  They would like him transferred ED to ED to [facility]. Accepting MD is Ashlee Stutsrim.      Douglas Fonda MATSU, MD 12/08/23 1346

## 2023-12-02 NOTE — ED Provider Notes (Addendum)
 [place] [facility] Provider Note   CSN: [id number] Arrival date & time: 12/02/23  9047     History  Chief Complaint  Patient presents with   Leg Pain    Douglas Gay is a 78 y.o. male.  Patient is a 78 year old male with past medical history of breast cancer with left-sided mastectomy completed 2 days ago on 11/30/2023 presenting for pain and numbness/tingling of the left lower extremity.  Patient's Xarelto  was held for the procedure.  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  Patient states the intermittent pneumatic compression devices were placed on his lower extremities while he was in the hospital but were never turned on.  Patient denies wounds, swelling, or warmth.  The history is provided by the patient. No language interpreter was used.  Leg Pain Associated symptoms: no back pain and no fever        Home Medications Prior to Admission medications   Medication Sig Start Date End Date Taking? Authorizing Provider  aspirin EC 81 MG tablet Take 81 mg by mouth daily. Swallow whole.   Yes [provider]  amLODipine  (NORVASC ) 2.5 MG tablet Take 2.5 mg by mouth daily.    [provider]  ascorbic acid (VITAMIN C) 500 MG tablet Take 500 mg by mouth 2 (two) times daily.    [provider]  calcium  carbonate (OS-CAL) 1250 (500 Ca) MG chewable tablet Chew 1 tablet by mouth 2 (two) times daily.    [provider]  carboxymethylcellul-glycerin (REFRESH RELIEVA) 0.5-0.9 % ophthalmic solution Place 1 drop into both eyes in the morning and at bedtime.    [provider]  cholecalciferol (VITAMIN D3) 25 MCG (1000 UNIT) tablet Take 1,000 Units by mouth in the morning and at bedtime.    [provider]  diphenhydramine -acetaminophen   (TYLENOL  PM) 25-500 MG TABS tablet Take 1 tablet by mouth at bedtime as needed (sleep).    [provider]  escitalopram  (LEXAPRO ) 10 MG tablet Take 10 mg by mouth daily.    [provider]  ferrous sulfate 325 (65 FE) MG tablet Take 325 mg by mouth 2 (two) times daily with a meal.    [provider]  folic acid (FOLVITE) 400 MCG tablet Take 400 mcg by mouth daily.    [provider]  lisinopril  (ZESTRIL ) 40 MG tablet Take 40 mg by mouth daily.    [provider]  methocarbamol  (ROBAXIN ) 500 MG tablet Take 1 tablet (500 mg total) by mouth every 6 (six) hours as needed for muscle spasms. 12/18/21   Haus, Leeanne R, PA  omeprazole (PRILOSEC) 20 MG capsule Take 20 mg by mouth every morning.    [provider]  ondansetron  (ZOFRAN ) 4 MG tablet Take 1 tablet (4 mg total) by mouth every 6 (six) hours as needed for nausea. 12/18/21   Stephen Olympia SAUNDERS, PA  oxyCODONE  (OXY IR/ROXICODONE ) 5 MG immediate release tablet Take 1-2 tablets (5-10 mg total) by mouth every 4 (four) hours as needed for moderate pain (pain score 4-6). 12/18/21   Stephen Olympia SAUNDERS, PA  rivaroxaban  (XARELTO ) 20 MG TABS tablet Take 20 mg by mouth daily with supper.    [provider]  rOPINIRole  (REQUIP ) 0.25 MG tablet Take 0.25 mg by mouth at bedtime.    [provider]  rosuvastatin  (CRESTOR ) 20 MG tablet Take 20 mg by mouth every morning.    [provider]  traMADol  (ULTRAM ) 50 MG tablet Take 1 tablet (50 mg total) by mouth every 6 (six) hours. 12/18/21   Haus, Leeanne R, PA      Allergies    Patient has no known allergies.    Review of Systems   Review of Systems  Constitutional:  Negative for chills and fever.  HENT:  Negative for ear pain and sore throat.   Eyes:  Negative for pain and visual disturbance.  Respiratory:  Negative for cough and shortness of breath.   Cardiovascular:  Negative for chest pain and palpitations.  Gastrointestinal:  Negative  for abdominal pain and vomiting.  Genitourinary:  Negative for dysuria and hematuria.  Musculoskeletal:  Negative for arthralgias and back pain.  Skin:  Negative for color change and rash.  Neurological:  Negative for seizures and syncope.  All other systems reviewed and are negative.   Physical Exam Updated Vital Signs BP (!) 159/73   Pulse 68   Temp 97.7 F (36.5 C) (Oral)   Resp 15   Ht 5' 10 (1.778 m)   Wt 81.6 kg   SpO2 96%   BMI 25.83 kg/m  Physical Exam Vitals and nursing note reviewed.  Constitutional:      General: He is not in acute distress.    Appearance: He is well-developed.  HENT:     Head: Normocephalic and atraumatic.  Eyes:     Conjunctiva/sclera: Conjunctivae normal.  Cardiovascular:     Rate and Rhythm: Normal rate and regular rhythm.     Pulses:          Radial pulses are 2+ on the right side and 2+ on the left side.       Dorsalis pedis pulses are 0 on the right side and 2+ on the left side.     Heart sounds: No murmur heard.    Comments: Right lower extremity is cooler as compared to the left lower extremity.  Unable to palpate dorsalis pedis pulses on the right.  Palpable +2 on the left.  Delayed cap refill on the right.  No skin color discoloration.  Pulmonary:     Effort: Pulmonary effort is normal. No respiratory distress.     Breath sounds: Normal breath sounds.  Abdominal:     Palpations: Abdomen is soft.     Tenderness: There is no abdominal tenderness.  Musculoskeletal:        General: No swelling.     Cervical back: Neck supple.  Skin:    General: Skin is warm and dry.     Capillary Refill: Capillary refill takes less than 2 seconds.  Neurological:     Mental Status: He is alert.  Psychiatric:        Mood and Affect: Mood normal.    ED Results / Procedures / Treatments   Labs (all labs ordered are listed, but only abnormal results are displayed) Labs Reviewed  CBC WITH DIFFERENTIAL/PLATELET  BASIC METABOLIC PANEL     EKG None  Radiology No results found.  Procedures .Critical Care  Performed by: Elnor Bernarda SQUIBB, DO Authorized by: Elnor Bernarda SQUIBB, DO   Critical care provider statement:    Critical care time (minutes):  106   Critical care was time spent personally by me on the following activities:  Development of treatment plan with patient or surrogate, discussions with consultants, evaluation of patient's response to  treatment, examination of patient, ordering and review of laboratory studies, ordering and review of radiographic studies, ordering and performing treatments and interventions, pulse oximetry, re-evaluation of patient's condition and review of old charts     Medications Ordered in ED Medications - No data to display  ED Course/ Medical Decision Making/ A&P                                 Medical Decision Making Amount and/or Complexity of Data Reviewed Labs: ordered. Radiology: ordered.  Risk Prescription drug management.   78 year old male with past medical history of breast cancer with left-sided mastectomy completed 2 days ago on 11/30/2023 presenting for pain and numbness/tingling of the left lower extremity.  Patient is alert and active x 3, no acute distress, afebrile, stable vital signs.  Physical exam concerning for cool right lower extremity as compared to the left with inability to palpate dorsalis pedis pulse.  No skin discoloration.  Left lower extremity palpable +2 dorsalis pedis pulse.  Warm to the touch.  Patient sent down for CTA lower extremity with runoff to the foot for concerns of arterial occlusion.  Labs to be waived due to urgency of diagnosis selected.  Patient still has not gone down to CT.  I contacted his nurse who will contact CT.  I reiterated that the labs are waived for emergency process.  She is verbally communicated with CT.  1:35 I personally called CT at this time due to patient still not going down for his emergent imaging.  They have  verbally communicated that they did not realize that the labs were being waived.  They are grabbing patient now.  3:04 PM CTA demonstrates: VASCULAR  1. RIGHT common iliac artery stent, with chronic-appearing in-stent stenosis and acute superimposed thrombosis/occlusion. 2.  Aortic Atherosclerosis (ICD10-I70.0).   NON-VASCULAR   1. No acute nonvascular abdominopelvic process. 2. Cholelithiasis and sigmoid diverticulosis. Additional incidental, chronic and senescent findings as above.   Consult to vascular ordered through [facility] for continuity of care.  3:49 PM No call back from [facility]. Repeat call sent by secretary.   Patient signed out to oncoming physician while awaiting for critical callback for limb ischemia.  Plan for admit and transfer.        Final Clinical Impression(s) / ED Diagnoses Final diagnoses:  None    Rx / DC Orders ED Discharge Orders     None         Elnor Bernarda SQUIBB, DO 12/11/23 1023    Elnor Bernarda P, DO 12/11/23 1023

## 2023-12-02 NOTE — ED Triage Notes (Signed)
 In for eval of soreness, numbness, slight swelling and discoloration to right leg.  PMH DVT - recent hospitalization Takes asa and zarelto daily. Stopped zarelto Monday and asa prior to take prior to left mastostomy on Tuesday the 4th.

## 2023-12-02 NOTE — Progress Notes (Signed)
 PHARMACY - ANTICOAGULATION CONSULT NOTE  Pharmacy Consult for heparin  Indication:  LE arterial thrombosis  No Known Allergies  Patient Measurements: Height: 5' 10 (177.8 cm) Weight: 81.6 kg (180 lb) IBW/kg (Calculated) : 73 Heparin  Dosing Weight: 81.6kg  Vital Signs: Temp: 98.1 F (36.7 C) (02/06 1427) Temp Source: Oral (02/06 1427) BP: 165/84 (02/06 1600) Pulse Rate: 71 (02/06 1600)  Labs: Recent Labs    12/02/23 1109  HGB 10.9*  HCT 32.7*  PLT 99*  CREATININE 1.41*    Estimated Creatinine Clearance: 45.3 mL/min (A) (by C-G formula based on SCr of 1.41 mg/dL (H)).   Medical History: Past Medical History:  Diagnosis Date   Anxiety    Arthritis    Breast cancer (HCC)    Chronic kidney disease    Depression    GERD (gastroesophageal reflux disease)    History of DVT of lower extremity    Leiden factor V   History of gout    History of kidney stones    Hyperlipidemia    Hypertension    Peripheral vascular disease (HCC)    DVT x3   Prostate cancer (HCC)    Tinnitus    bi lat    Medications:  Infusions:   heparin  1,300 Units/hr (12/02/23 1538)    Assessment: 77 yom presented to the ED with leg pain. Found to have an arterial thrombosis and now initiating heparin . He was xarelto  PTA but this has been recently stopped for surgery on 2/4. Had been instructed to restart today. Of note, his platelets are acutely low at 99. Previously normal.   Previously documented to hold initial heparin  bolus. ED MD has discussed with vascular at this point and would now like to give heparin  bolus.  Goal of Therapy:  Heparin  level 0.3-0.7 units/ml aPTT 66-103 seconds Monitor platelets by anticoagulation protocol: Yes   Plan:  Give 4700 unit IV heparin  bolus Proceed with previously started plan: Heparin  gtt 1300 units/hr  Check an 8 hr HL - will also check aPTT and CBC too Daily HL and CBC   Dorn Buttner, PharmD, BCPS 12/02/2023 4:29 PM ED Clinical Pharmacist -   [phone]

## 2023-12-02 NOTE — Progress Notes (Signed)
 PHARMACY - ANTICOAGULATION CONSULT NOTE  Pharmacy Consult for heparin  Indication:  LE arterial thrombosis  No Known Allergies  Patient Measurements: Height: 5' 10 (177.8 cm) Weight: 81.6 kg (180 lb) IBW/kg (Calculated) : 73 Heparin  Dosing Weight: 81.6kg  Vital Signs: Temp: 98.1 F (36.7 C) (02/06 1427) Temp Source: Oral (02/06 1427) BP: 155/83 (02/06 1427) Pulse Rate: 68 (02/06 1427)  Labs: Recent Labs    12/02/23 1109  HGB 10.9*  HCT 32.7*  PLT 99*  CREATININE 1.41*    Estimated Creatinine Clearance: 45.3 mL/min (A) (by C-G formula based on SCr of 1.41 mg/dL (H)).   Medical History: Past Medical History:  Diagnosis Date   Anxiety    Arthritis    Breast cancer (HCC)    Chronic kidney disease    Depression    GERD (gastroesophageal reflux disease)    History of DVT of lower extremity    Leiden factor V   History of gout    History of kidney stones    Hyperlipidemia    Hypertension    Peripheral vascular disease (HCC)    DVT x3   Prostate cancer (HCC)    Tinnitus    bi lat    Medications:  Infusions:   heparin       Assessment: 77 yom presented to the ED with leg pain. Found to have an arterial thrombosis and now initiating heparin . He was xarelto  PTA but this has been recently stopped for surgery on 2/4. Had been instructed to restart today. Of note, his platelets are acutely low at 99. Previously normal. Discussed with MD to hold off on bolus for now until further discussions with vascular.   Goal of Therapy:  Heparin  level 0.3-0.7 units/ml aPTT 66-103 seconds Monitor platelets by anticoagulation protocol: Yes   Plan:  Heparin  gtt 1300 units/hr  Check an 8 hr HL - will also check aPTT and CBC too Daily HL and CBC   Deb Loudin, Vernell Helling 12/02/2023,3:34 PM
